# Patient Record
Sex: Female | Born: 1935 | Race: White | Hispanic: No | Marital: Single | Smoking: Never smoker
Health system: Southern US, Community
[De-identification: ages and names within clinical notes are randomized; demographics above are authoritative.]

## PROBLEM LIST (undated history)

## (undated) DIAGNOSIS — M1990 Unspecified osteoarthritis, unspecified site: Secondary | ICD-10-CM

## (undated) HISTORY — PX: SHOULDER SURGERY: SHX246

---

## 2017-12-18 ENCOUNTER — Emergency Department: Payer: Medicare PPO

## 2017-12-18 ENCOUNTER — Inpatient Hospital Stay: Payer: Medicare PPO

## 2017-12-18 ENCOUNTER — Inpatient Hospital Stay
Admission: EM | Admit: 2017-12-18 | Discharge: 2017-12-25 | DRG: 871 | Disposition: A | Discharge status: Skilled Nursing Facility | Payer: Medicare PPO | Attending: Internal Medicine | Admitting: Internal Medicine

## 2017-12-18 DIAGNOSIS — I459 Conduction disorder, unspecified: Secondary | ICD-10-CM

## 2017-12-18 DIAGNOSIS — J9811 Atelectasis: Secondary | ICD-10-CM | POA: Diagnosis present

## 2017-12-18 DIAGNOSIS — R55 Syncope and collapse: Secondary | ICD-10-CM | POA: Diagnosis not present

## 2017-12-18 DIAGNOSIS — I248 Other forms of acute ischemic heart disease: Secondary | ICD-10-CM | POA: Diagnosis present

## 2017-12-18 DIAGNOSIS — J9601 Acute respiratory failure with hypoxia: Secondary | ICD-10-CM | POA: Diagnosis present

## 2017-12-18 DIAGNOSIS — E872 Acidosis: Secondary | ICD-10-CM | POA: Diagnosis present

## 2017-12-18 DIAGNOSIS — K5732 Diverticulitis of large intestine without perforation or abscess without bleeding: Secondary | ICD-10-CM | POA: Diagnosis present

## 2017-12-18 DIAGNOSIS — Z66 Do not resuscitate: Secondary | ICD-10-CM | POA: Diagnosis present

## 2017-12-18 DIAGNOSIS — W19XXXA Unspecified fall, initial encounter: Secondary | ICD-10-CM

## 2017-12-18 DIAGNOSIS — A419 Sepsis, unspecified organism: Principal | ICD-10-CM

## 2017-12-18 DIAGNOSIS — R7989 Other specified abnormal findings of blood chemistry: Secondary | ICD-10-CM

## 2017-12-18 DIAGNOSIS — R001 Bradycardia, unspecified: Secondary | ICD-10-CM | POA: Diagnosis present

## 2017-12-18 DIAGNOSIS — I442 Atrioventricular block, complete: Secondary | ICD-10-CM | POA: Diagnosis present

## 2017-12-18 DIAGNOSIS — E876 Hypokalemia: Secondary | ICD-10-CM | POA: Diagnosis present

## 2017-12-18 DIAGNOSIS — K529 Noninfective gastroenteritis and colitis, unspecified: Secondary | ICD-10-CM | POA: Diagnosis present

## 2017-12-18 DIAGNOSIS — M1990 Unspecified osteoarthritis, unspecified site: Secondary | ICD-10-CM | POA: Diagnosis present

## 2017-12-18 HISTORY — DX: Unspecified osteoarthritis, unspecified site: M19.90

## 2017-12-18 LAB — BASIC METABOLIC PANEL
Anion gap: 13 (ref 5–15)
BUN: 18 mg/dL (ref 6–20)
CHLORIDE: 99 mmol/L — AB (ref 101–111)
CO2: 26 mmol/L (ref 22–32)
Calcium: 8.8 mg/dL — ABNORMAL LOW (ref 8.9–10.3)
Creatinine, Ser: 0.96 mg/dL (ref 0.44–1.00)
GFR calc Af Amer: >60 mL/min (ref >60)
GFR calc non Af Amer: 54 mL/min — ABNORMAL LOW (ref >60)
Glucose, Bld: 220 mg/dL — ABNORMAL HIGH (ref 65–99)
POTASSIUM: 3.1 mmol/L — AB (ref 3.5–5.1)
SODIUM: 138 mmol/L (ref 135–145)

## 2017-12-18 LAB — CBC
HEMATOCRIT: 37 % (ref 35.0–47.0)
Hemoglobin: 11.6 g/dL — ABNORMAL LOW (ref 12.0–16.0)
MCH: 25.8 pg — ABNORMAL LOW (ref 26.0–34.0)
MCHC: 31.5 g/dL — ABNORMAL LOW (ref 32.0–36.0)
MCV: 82.1 fL (ref 80.0–100.0)
Platelets: 513 10*3/uL — ABNORMAL HIGH (ref 150–440)
RBC: 4.51 MIL/uL (ref 3.80–5.20)
RDW: 17.3 % — AB (ref 11.5–14.5)
WBC: 22.9 10*3/uL — AB (ref 3.6–11.0)

## 2017-12-18 LAB — URINALYSIS, COMPLETE (UACMP) WITH MICROSCOPIC
BILIRUBIN URINE: NEGATIVE
Bacteria, UA: NONE SEEN
GLUCOSE, UA: NEGATIVE mg/dL
KETONES UR: NEGATIVE mg/dL
LEUKOCYTES UA: NEGATIVE
Nitrite: NEGATIVE
PH: 5 (ref 5.0–8.0)
Protein, ur: 100 mg/dL — AB
Specific Gravity, Urine: 1.018 (ref 1.005–1.030)

## 2017-12-18 LAB — TROPONIN I
TROPONIN I: 0.56 ng/mL — AB (ref <0.03)
Troponin I: 0.06 ng/mL (ref <0.03)

## 2017-12-18 LAB — GLUCOSE, CAPILLARY: Glucose-Capillary: 129 mg/dL — ABNORMAL HIGH (ref 65–99)

## 2017-12-18 LAB — LACTIC ACID, PLASMA
Lactic Acid, Venous: 5.9 mmol/L (ref 0.5–1.9)
Lactic Acid, Venous: 2 mmol/L (ref 0.5–1.9)

## 2017-12-18 LAB — MAGNESIUM: MAGNESIUM: 1.6 mg/dL — AB (ref 1.7–2.4)

## 2017-12-18 MED ORDER — IOHEXOL 300 MG/ML  SOLN: 100.0000 mL | Freq: Once | INTRAMUSCULAR | Status: DC | PRN

## 2017-12-18 MED ORDER — PIPERACILLIN-TAZOBACTAM 3.375 G IVPB 30 MIN
3.3750 g | Freq: Once | INTRAVENOUS | Status: AC
  Administered 2017-12-18: 3.375 g via INTRAVENOUS
  Filled 2017-12-18: qty 50

## 2017-12-18 MED ORDER — PIPERACILLIN-TAZOBACTAM 3.375 G IVPB: 3.3750 g | Freq: Three times a day (TID) | INTRAVENOUS | Status: DC

## 2017-12-18 MED ORDER — LIDOCAINE HCL (PF) 1 % IJ SOLN
INTRAMUSCULAR | Status: AC
  Administered 2017-12-18: 10 mL
  Filled 2017-12-18: qty 10

## 2017-12-18 MED ORDER — SODIUM CHLORIDE 0.9 % IV BOLUS: 1000.0000 mL | Freq: Once | INTRAVENOUS | Status: DC

## 2017-12-18 MED ORDER — FENTANYL CITRATE (PF) 100 MCG/2ML IJ SOLN
50.0000 ug | Freq: Once | INTRAMUSCULAR | Status: AC
  Administered 2017-12-18: 50 ug via INTRAVENOUS

## 2017-12-18 MED ORDER — FENTANYL CITRATE (PF) 100 MCG/2ML IJ SOLN
25.0000 ug | Freq: Once | INTRAMUSCULAR | Status: AC
  Administered 2017-12-18: 25 ug via INTRAVENOUS

## 2017-12-18 MED ORDER — SODIUM CHLORIDE 0.9 % IV SOLN
Freq: Three times a day (TID) | INTRAVENOUS | Status: DC
  Administered 2017-12-19: 06:00:00 via INTRAVENOUS
  Filled 2017-12-18 (×5): qty 100

## 2017-12-18 MED ORDER — ONDANSETRON HCL 4 MG PO TABS: 4.0000 mg | ORAL_TABLET | Freq: Four times a day (QID) | ORAL | Status: DC | PRN

## 2017-12-18 MED ORDER — FENTANYL CITRATE (PF) 100 MCG/2ML IJ SOLN
INTRAMUSCULAR | Status: AC
  Filled 2017-12-18: qty 2

## 2017-12-18 MED ORDER — ENOXAPARIN SODIUM 40 MG/0.4ML ~~LOC~~ SOLN
40.0000 mg | Freq: Two times a day (BID) | SUBCUTANEOUS | Status: DC
  Administered 2017-12-18 – 2017-12-19 (×2): 40 mg via SUBCUTANEOUS
  Filled 2017-12-18 (×2): qty 0.4

## 2017-12-18 MED ORDER — ACETAMINOPHEN 325 MG PO TABS
650.0000 mg | ORAL_TABLET | Freq: Four times a day (QID) | ORAL | Status: DC | PRN
  Administered 2017-12-21: 650 mg via ORAL
  Filled 2017-12-18: qty 2

## 2017-12-18 MED ORDER — LIDOCAINE HCL (PF) 1 % IJ SOLN
10.0000 mL | Freq: Once | INTRAMUSCULAR | Status: AC
  Administered 2017-12-18: 10 mL
  Filled 2017-12-18: qty 10

## 2017-12-18 MED ORDER — SODIUM CHLORIDE 0.9 % IV BOLUS
1000.0000 mL | Freq: Once | INTRAVENOUS | Status: AC
Start: 2017-12-18 — End: 2017-12-18
  Administered 2017-12-18 (×2): 1000 mL via INTRAVENOUS

## 2017-12-18 MED ORDER — ACETAMINOPHEN 650 MG RE SUPP: 650.0000 mg | Freq: Four times a day (QID) | RECTAL | Status: DC | PRN

## 2017-12-18 MED ORDER — TRAMADOL HCL 50 MG PO TABS
50.0000 mg | ORAL_TABLET | Freq: Four times a day (QID) | ORAL | Status: DC | PRN
  Administered 2017-12-18 – 2017-12-23 (×4): 50 mg via ORAL
  Filled 2017-12-18 (×3): qty 1

## 2017-12-18 MED ORDER — VANCOMYCIN HCL IN DEXTROSE 1-5 GM/200ML-% IV SOLN
1000.0000 mg | Freq: Once | INTRAVENOUS | Status: DC
  Administered 2017-12-18: 1000 mg via INTRAVENOUS
  Filled 2017-12-18: qty 200

## 2017-12-18 MED ORDER — IOPAMIDOL (ISOVUE-300) INJECTION 61%
30.0000 mL | Freq: Once | INTRAVENOUS | Status: AC
  Administered 2017-12-18: 30 mL via ORAL

## 2017-12-18 MED ORDER — POTASSIUM CHLORIDE 10 MEQ/100ML IV SOLN
10.0000 meq | INTRAVENOUS | Status: AC
  Administered 2017-12-18: 10 meq via INTRAVENOUS
  Filled 2017-12-18 (×4): qty 100

## 2017-12-18 MED ORDER — MIDAZOLAM HCL 5 MG/5ML IJ SOLN
INTRAMUSCULAR | Status: AC
  Filled 2017-12-18: qty 5

## 2017-12-18 MED ORDER — POTASSIUM CHLORIDE 10 MEQ/100ML IV SOLN
10.0000 meq | INTRAVENOUS | Status: AC
  Administered 2017-12-18 – 2017-12-19 (×3): 10 meq via INTRAVENOUS
  Filled 2017-12-18 (×3): qty 100

## 2017-12-18 MED ORDER — SODIUM CHLORIDE 0.9% FLUSH
3.0000 mL | Freq: Two times a day (BID) | INTRAVENOUS | Status: DC
  Administered 2017-12-18 – 2017-12-25 (×14): 3 mL via INTRAVENOUS

## 2017-12-18 MED ORDER — ONDANSETRON HCL 4 MG/2ML IJ SOLN: 4.0000 mg | Freq: Four times a day (QID) | INTRAMUSCULAR | Status: DC | PRN

## 2017-12-18 MED ORDER — IOPAMIDOL (ISOVUE-300) INJECTION 61%
100.0000 mL | Freq: Once | INTRAVENOUS | Status: AC | PRN
  Administered 2017-12-18: 100 mL via INTRAVENOUS

## 2017-12-18 MED ORDER — TRAMADOL HCL 50 MG PO TABS
ORAL_TABLET | ORAL | Status: AC
  Administered 2017-12-19: 50 mg via ORAL
  Filled 2017-12-18: qty 1

## 2017-12-18 MED ORDER — ALBUTEROL SULFATE (2.5 MG/3ML) 0.083% IN NEBU: 2.5000 mg | INHALATION_SOLUTION | RESPIRATORY_TRACT | Status: DC | PRN

## 2017-12-18 NOTE — ED Notes (Signed)
Family visiting pt at bedside  °

## 2017-12-18 NOTE — Progress Notes (Signed)
Pharmacy Antibiotic Note  Sydney MonksCarol Ziff is a 84 y.o. female admitted on 12/18/2017 with IAI.  Pharmacy has been consulted for Zosyn dosing.  Plan: Zosyn 4.5 gm IV Q8H EI (due to BMI greater than 40)  Height: 5\' 5"  (165.1 cm) Weight: 252 lb 3.2 oz (114.4 kg) IBW/kg (Calculated) : 57  No data recorded.  Recent Labs  Lab 12/18/17 1618 12/18/17 1633  WBC 22.9*  --   CREATININE 0.96  --   LATICACIDVEN  --  5.9*    Estimated Creatinine Clearance: 57.1 mL/min (by C-G formula based on SCr of 0.96 mg/dL).    No Known Allergies  Antimicrobials this admission: Vanc/Zosyn in ED  Dose adjustments this admission:   Microbiology results: 4/16 BCx: pending  UCx:    Sputum:    MRSA PCR:   Thank you for allowing pharmacy to be a part of this patient's care.  Carola FrostNathan A Ellesse Antenucci, Pharm.D., BCPS Clinical Pharmacist 12/18/2017 6:34 PM

## 2017-12-18 NOTE — H&P (Signed)
[facility] [place] at [facility]   PATIENT NAME: Sydney Mitchell    MR#:  [id number]  DATE OF BIRTH:  05/30/1934  DATE OF ADMISSION:  12/18/2017  PRIMARY CARE PHYSICIAN: Patient, No Pcp Per   REQUESTING/REFERRING PHYSICIAN: Dr. Derrill Kay  CHIEF COMPLAINT:   Chief Complaint  Patient presents with  . Bradycardia  . Fall    HISTORY OF PRESENT ILLNESS:  Sydney Mitchell  is a 84 y.o. female with a known history of osteoarthritis presented to the hospital after patient had dizziness and fall.  EMS was called.  Patient was found to be bradycardic in the 20s.  A temporary pacemaker was placed.  Pulse was lost in the middle and she had CPR for 2-3 minutes.  Regained circulation.  Patient was alert and oriented when she arrived to the emergency room.  Due to bradycardia patient had a temporary pacemaker placed by Dr. Lady Gary. Patient has had diarrhea for a week of watery 5-6 episodes of stool daily with abdominal pain and nausea.  No blood in stool.  No recent antibiotic use.  Afebrile.  Patient does have elevated white count of 22.6.  Lactic acid at 5.9.  PAST MEDICAL HISTORY:   Past Medical History:  Diagnosis Date  . Osteoarthritis     PAST SURGICAL HISTORY:  None SOCIAL HISTORY:   Social History   Tobacco Use  . Smoking status: Not on file  Substance Use Topics  . Alcohol use: Not on file    FAMILY HISTORY:  No family history on file. No colon cancer  DRUG ALLERGIES:  No Known Allergies  REVIEW OF SYSTEMS:   Review of Systems  Constitutional: Positive for malaise/fatigue. Negative for chills, fever and weight loss.  HENT: Negative for hearing loss and nosebleeds.   Eyes: Negative for blurred vision, double vision and pain.  Respiratory: Negative for cough, hemoptysis, sputum production, shortness of breath and wheezing.   Cardiovascular: Negative for chest pain, palpitations, orthopnea and leg swelling.  Gastrointestinal: Positive for abdominal pain,  diarrhea and nausea. Negative for constipation and vomiting.  Genitourinary: Negative for dysuria and hematuria.  Musculoskeletal: Positive for falls and joint pain. Negative for back pain and myalgias.  Skin: Negative for rash.  Neurological: Positive for dizziness. Negative for tremors, sensory change, speech change, focal weakness, seizures and headaches.  Endo/Heme/Allergies: Does not bruise/bleed easily.  Psychiatric/Behavioral: Negative for depression and memory loss. The patient is not nervous/anxious.     MEDICATIONS AT HOME:   Prior to Admission medications   Not on File     VITAL SIGNS:  Blood pressure (!) 115/45, pulse 70, resp. rate (!) 22, height 5\' 5"  (1.651 m), weight 114.4 kg (252 lb 3.2 oz), SpO2 96 %.  PHYSICAL EXAMINATION:  Physical Exam  GENERAL:  84 y.o.-year-old patient lying in the bed.  Looks critically ill.  obese EYES: Pupils equal, round, reactive to light and accommodation. No scleral icterus. Extraocular muscles intact.  HEENT: Head atraumatic, normocephalic. Oropharynx and nasopharynx clear. No oropharyngeal erythema, moist oral mucosa  NECK:  Supple, no jugular venous distention. No thyroid enlargement, no tenderness.  LUNGS: Normal breath sounds bilaterally, no wheezing, rales, rhonchi. No use of accessory muscles of respiration.  CARDIOVASCULAR: S1, S2 normal. No murmurs, rubs, or gallops.  ABDOMEN: Soft, diffuse tenderness, nondistended. Bowel sounds present. No organomegaly or mass.  EXTREMITIES: No pedal edema, cyanosis, or clubbing. + 2 pedal & radial pulses b/l.   NEUROLOGIC: Cranial nerves II through XII are intact. No focal  Motor or sensory deficits appreciated b/l PSYCHIATRIC: The patient is alert and oriented x 3. Good affect.  SKIN: No obvious rash.  LABORATORY PANEL:   CBC Recent Labs  Lab 12/18/17 1618  WBC 22.9*  HGB 11.6*  HCT 37.0  PLT 513*    ------------------------------------------------------------------------------------------------------------------  Chemistries  Recent Labs  Lab 12/18/17 1618  NA 138  K 3.1*  CL 99*  CO2 26  GLUCOSE 220*  BUN 18  CREATININE 0.96  CALCIUM 8.8*   ------------------------------------------------------------------------------------------------------------------  Cardiac Enzymes Recent Labs  Lab 12/18/17 1618  TROPONINI 0.06*   ------------------------------------------------------------------------------------------------------------------  RADIOLOGY:  No results found.   IMPRESSION AND PLAN:   *Bradycardia.  Etiology unclear.  Patient does not take any medications.  Could be electrolyte imbalances.  At this time she has a temporary pacemaker.  Discussed with cardiology Dr. Lady GaryFath.  Will get echocardiogram. Replace potassium.  Check magnesium.  *Sepsis with diarrhea and abdominal pain.  We will start IV antibiotics.  Check C. difficile and stool PCR.  Also ordered a CT scan of the abdomen and pelvis to rule out colitis or diverticulitis.  *Osteoarthritis.  Pain medications as needed.  *DVT prophylaxis with Lovenox  All the records are reviewed and case discussed with ED provider. Management plans discussed with the patient, family and they are in agreement.  Discussed with patient in detail regarding acute episode and treatment plan.  We discussed regarding her CODE STATUS.  Patient does not want intubation or CPR.  She is okay having a permanent pacemaker placed. Patient's daughter Sydney Mitchell is her healthcare power of attorney. Discussed with daughter in detail.  CODE STATUS: DNR  TOTAL CRITICAL CARE  TIME TAKING CARE OF THIS PATIENT: 80 minutes.   Orie FishermanSrikar R Teal Raben M.D on 12/18/2017 at 6:21 PM  Between 7am to 6pm - Pager - [phone]  After 6pm go to [email or web address] - password [email or web address]  [facility]  Office  [phone]  CC: Primary  care physician; Patient, No Pcp Per  Note: This dictation was prepared with Dragon dictation along with smaller phrase technology. Any transcriptional errors that result from this process are unintentional.

## 2017-12-18 NOTE — ED Notes (Signed)
Attempted to call report. Gave my name and number and awaiting call back from ICU RN.

## 2017-12-18 NOTE — ED Provider Notes (Signed)
[facility] Emergency Department Provider Note  ____________________________________________   I have reviewed the triage vital signs and the nursing notes.   HISTORY  Chief Complaint Bradycardia and Fall   History limited by: Not Limited   HPI Sydney Mitchell is a 84 y.o. female who presents to the emergency department today because of concern for fall. Patient is coming from home and came via EMS. When EMS arrived they noted that the patient's heart rate was in the 20s and 30s. Patient had decreased responsiveness. EMS started pacing the patient and patient became more lucid. The patient apparently has not been to a doctor in 4 years and is not on any medication. The patient is complaining of some headache.    No past medical history on file.  There are no active problems to display for this patient.   Prior to Admission medications   Not on File    Allergies Patient has no known allergies.  No family history on file.  Social History Social History   Tobacco Use  . Smoking status: Not on file  Substance Use Topics  . Alcohol use: Not on file  . Drug use: Not on file    Review of Systems Constitutional: No fever/chills Eyes: No visual changes. ENT: No sore throat. Cardiovascular: Denies chest pain. Respiratory: Denies shortness of breath. Gastrointestinal: Positive for diarrhea. Genitourinary: Negative for dysuria. Musculoskeletal: Negative for back pain. Skin: Negative for rash. Neurological: Positive for headache  ____________________________________________   PHYSICAL EXAM:  VITAL SIGNS: ED Triage Vitals  Enc Vitals Group     BP 12/18/17 1630 (!) 96/57     Pulse Rate 12/18/17 1614 (!) 57     Resp 12/18/17 1614 (!) 23     Temp --      Temp src --      SpO2 --      Weight 12/18/17 1619 252 lb 3.2 oz (114.4 kg)     Height 12/18/17 1619 5\' 5"  (1.651 m)   Constitutional: Awake, alert.  Eyes: Conjunctivae are normal.   ENT   Head: Normocephalic and atraumatic.   Nose: No congestion/rhinnorhea.   Mouth/Throat: Mucous membranes are moist.   Neck: No stridor. Hematological/Lymphatic/Immunilogical: No cervical lymphadenopathy. Cardiovascular: Normal rate, actively being paced. Respiratory: Normal respiratory effort without tachypnea nor retractions. Breath sounds are clear and equal bilaterally. No wheezes/rales/rhonchi. Gastrointestinal: Soft and non tender. No rebound. No guarding.  Genitourinary: Deferred Musculoskeletal: Normal range of motion in all extremities. No lower extremity edema. Neurologic:  Normal speech and language. No gross focal neurologic deficits are appreciated.  Skin:  Skin is warm, dry and intact. No rash noted. Psychiatric: Mood and affect are normal. Speech and behavior are normal. Patient exhibits appropriate insight and judgment.  ____________________________________________    LABS (pertinent positives/negatives)  Lactic acid 5.9 BMP na 138, k 3.1, cl 99, glu 220 CBC wbc 22.9, hgb 11.6, plt 513 Trop 0.06  ____________________________________________   EKG  I, Phineas SemenGraydon Cristin Penaflor, attending physician, personally viewed and interpreted this EKG  EKG Time: 1613 Rate: 35 Rhythm: unclear 3rd degree vs 2nd degree mobitz type II AV block Axis: left axis deivation Intervals: qtc 474 QRS: IVCD ST changes: no st elevation Impression: abnormal ekg   ____________________________________________    RADIOLOGY  CT head/cervical spine No acute abnormality  ____________________________________________   PROCEDURES  Procedures  CRITICAL CARE Performed by: Phineas SemenGOODMAN, Andreya Lacks   Total critical care time: 40 minutes  Critical care time was exclusive of separately billable procedures and  treating other patients.  Critical care was necessary to treat or prevent imminent or life-threatening deterioration.  Critical care was time spent personally by me on  the following activities: development of treatment plan with patient and/or surrogate as well as nursing, discussions with consultants, evaluation of patient's response to treatment, examination of patient, obtaining history from patient or surrogate, ordering and performing treatments and interventions, ordering and review of laboratory studies, ordering and review of radiographic studies, pulse oximetry and re-evaluation of patient's condition.  ____________________________________________   INITIAL IMPRESSION / ASSESSMENT AND PLAN / ED COURSE  Pertinent labs & imaging results that were available during my care of the patient were reviewed by me and considered in my medical decision making (see chart for details).  Patient presented to the emergency department today because of concerns for fall and then bradycardia noted by EMS.  When patient arrived she was actively being paced.  She was awake and alert.  She was complaining of headache.  When we transferred the patient over to our pacer pads we did not get an EKG which did show high-grade AV block.  The patient was placed on her pacer pads successfully.  She had good capture.  Head CT was obtained given concern for headache and fall however no acute intracranial or cervical spine injury.  Dr. Lady Gary with cardiology was called and did place a temporary pacer.  Patient's blood work came back concerning for possible infection with significantly elevated lactic acid level and leukocytosis.  Patient was given multiple broad-spectrum antibiotics.  Patient will be admitted to the hospitalist service.  ____________________________________________   FINAL CLINICAL IMPRESSION(S) / ED DIAGNOSES  Final diagnoses:  Fall, initial encounter  Heart block  Bradycardia  Sepsis, due to unspecified organism (HCC)  Elevated lactic acid level     Note: This dictation was prepared with Dragon dictation. Any transcriptional errors that result from this process  are unintentional     Phineas Semen, MD 12/18/17 1843

## 2017-12-18 NOTE — Progress Notes (Signed)
CODE SEPSIS - PHARMACY COMMUNICATION  **Broad Spectrum Antibiotics should be administered within 1 hour of Sepsis diagnosis**  Time Code Sepsis Called/Page Received: 17:40  Antibiotics Ordered: Vanc/Zosyn  Time of 1st antibiotic administration: 17:05  Additional action taken by pharmacy:   If necessary, Name of Provider/Nurse Contacted:     Carola FrostNathan A Kelle Ruppert ,PharmD Clinical Pharmacist  12/18/2017  5:41 PM

## 2017-12-18 NOTE — ED Notes (Addendum)
Pt on 4 L nasal cannula at this time.   Pt states she got dizzy in bedroom, fell backwards on to carpet.

## 2017-12-18 NOTE — ED Notes (Signed)
Pt taken to CT and back with this RN, Trish MageAnna Rn, Dr. Derrill KayGoodman, and 2 ED techs. Pt is alert and talking. C/o of head pain. C/o of the beating on her chest pain, explained that is from the pacemaker which is keeping her heart rate up.

## 2017-12-18 NOTE — ED Notes (Signed)
Date and time results received: 12/18/17 1708  Test: troponin Critical Value: 0.06  Name of Provider Notified: Dr. Derrill KayGoodman

## 2017-12-18 NOTE — ED Notes (Signed)
Dr. Lady GaryFath at bedside to perform temporary pacemaker placement. Kadijah ED tech along with this RN at bedside to assist.

## 2017-12-18 NOTE — ED Notes (Signed)
Pt returned from xray

## 2017-12-18 NOTE — ED Notes (Signed)
Date and time results received: 12/18/17 1708   Test: lactic acid Critical Value: 5.9  Name of Provider Notified: Dr. Derrill KayGoodman

## 2017-12-18 NOTE — Consult Note (Signed)
Cardiology Consultation Note    Patient ID: Sydney Mitchell, MRN: [id number], DOB/AGE: 09-19-1933 84 y.o. Admit date: 12/18/2017   Date of Consult: 12/18/2017 Primary Physician: Patient, No Pcp Per Primary Cardiologist:     Chief Complaint: syncope Reason for Consultation: chb Requesting MD: Dr. Elpidio Anis  HPI: Nessa Ramaker is a 84 y.o. female with history of no aparent cardiac problems who recently moved [place] othe area. She has recently had anorexia and diarrhea. She states she has had liquid diarrhea which has worsened recently. She attempted to ambulate today but felt very dizzy followed by syncopep. EMS was called and found her to be hypotensive , bradycardic and unresponsive. She reportedly required cpr for several minutes. She had an external pacemaker placed. She remained hypotensive and radycardic. Her wbc was 22 and her K was 3.1. Her lactate was 5.9. Blood cultures were obtained.   Past Medical History:  Diagnosis Date  . Osteoarthritis       Surgical History:   Home Meds: Prior to Admission medications   Not on File    Inpatient Medications:  . enoxaparin (LOVENOX) injection  40 mg Subcutaneous Q12H  . fentaNYL      . sodium chloride flush  3 mL Intravenous Q12H   . small volume/piggyback builder    . sodium chloride      Allergies: No Known Allergies  Social History   Socioeconomic History  . Marital status: Single    Spouse name: Not on file  . Number of children: Not on file  . Years of education: Not on file  . Highest education level: Not on file  Occupational History  . Not on file  Social Needs  . Financial resource strain: Not on file  . Food insecurity:    Worry: Not on file    Inability: Not on file  . Transportation needs:    Medical: Not on file    Non-medical: Not on file  Tobacco Use  . Smoking status: Not on file  Substance and Sexual Activity  . Alcohol use: Not on file  . Drug use: Not on file  . Sexual activity: Not on file   Lifestyle  . Physical activity:    Days per week: Not on file    Minutes per session: Not on file  . Stress: Not on file  Relationships  . Social connections:    Talks on phone: Not on file    Gets together: Not on file    Attends religious service: Not on file    Active member of club or organization: Not on file    Attends meetings of clubs or organizations: Not on file    Relationship status: Not on file  . Intimate partner violence:    Fear of current or ex partner: Not on file    Emotionally abused: Not on file    Physically abused: Not on file    Forced sexual activity: Not on file  Other Topics Concern  . Not on file  Social History Narrative  . Not on file     No family history on file.   Review of Systems: A 12-system review of systems was performed and is negative except as noted in the HPI.  Labs: Recent Labs    12/18/17 1618  TROPONINI 0.06*   Lab Results  Component Value Date   WBC 22.9 (H) 12/18/2017   HGB 11.6 (L) 12/18/2017   HCT 37.0 12/18/2017   MCV 82.1 12/18/2017   PLT  513 (H) 12/18/2017    Recent Labs  Lab 12/18/17 1618  NA 138  K 3.1*  CL 99*  CO2 26  BUN 18  CREATININE 0.96  CALCIUM 8.8*  GLUCOSE 220*   No results found for: CHOL, HDL, LDLCALC, TRIG No results found for: DDIMER  Radiology/Studies:  Ct Head Wo Contrast  Result Date: 12/18/2017 CLINICAL DATA:  Fall. Dizziness. Bradycardia and brief CPR. Initial encounter. EXAM: CT HEAD WITHOUT CONTRAST CT CERVICAL SPINE WITHOUT CONTRAST TECHNIQUE: Multidetector CT imaging of the head and cervical spine was performed following the standard protocol without intravenous contrast. Multiplanar CT image reconstructions of the cervical spine were also generated. COMPARISON:  None. FINDINGS: CT HEAD FINDINGS Brain: There is no evidence of acute infarct, intracranial hemorrhage, mass, midline shift, or extra-axial fluid collection. Subcortical and periventricular white matter hypodensities  are nonspecific but compatible with mild-to-moderate chronic small vessel ischemic disease. There is mild-to-moderate cerebral atrophy. Vascular: Calcified atherosclerosis at the skull base. No hyperdense vessel. Skull: No fracture or focal osseous lesion. Sinuses/Orbits: Visualized paranasal sinuses and mastoid air cells are clear. Orbits are unremarkable. Other: Small right frontal scalp hematoma. CT CERVICAL SPINE FINDINGS Alignment: Trace anterolisthesis of C2 on C3 and trace retrolisthesis of C4 on C5, likely degenerative. Skull base and vertebrae: No acute fracture or suspicious osseous lesion. Soft tissues and spinal canal: No prevertebral fluid or swelling. No visible canal hematoma. Disc levels: Diffuse cervical disc degeneration, most advanced at C3-4 and C4-5 where there is severe disc space narrowing, endplate sclerosis, and endplate osteophyte formation. Mild-to-moderate spinal stenosis at C4-5. Moderate to severe left facet arthrosis at C2-3 with moderate facet arthrosis at other levels. Advanced right-sided neural foraminal stenosis from C3-4-C5-6. Disc calcification at C7-T1. Upper chest: Clear lung apices. Other: Extensive calcified atherosclerosis at the carotid bifurcations. IMPRESSION: 1. No evidence of acute intracranial abnormality. 2. Small right frontal scalp hematoma. 3. Mild-to-moderate chronic small vessel ischemic disease and cerebral atrophy. 4. No cervical spine fracture. 5. Advanced cervical disc degeneration. Electronically Signed   By: Sebastian Ache M.D.   On: 12/18/2017 18:22   Ct Chest W Contrast  Result Date: 12/18/2017 CLINICAL DATA:  Bradycardic. CPR for pulseless. Recent diarrhea. Elevated white blood cell count EXAM: CT CHEST, ABDOMEN, AND PELVIS WITH CONTRAST TECHNIQUE: Multidetector CT imaging of the chest, abdomen and pelvis was performed following the standard protocol during bolus administration of intravenous contrast. CONTRAST:  <See Chart> OMNIPAQUE IOHEXOL 300  MG/ML SOLN, ISOVUE-300 IOPAMIDOL (ISOVUE-300) INJECTION 61% COMPARISON:  Chest radiograph 12/18/2017 FINDINGS: CT CHEST FINDINGS Cardiovascular: The pacing wires in the RIGHT atrium and ventricle from a LEFT groin approach no pericardial effusion. Coronary calcifications present no central pulmonary embolism. Mediastinum/Nodes: No axillary or supraclavicular adenopathy. No mediastinal hilar adenopathy. No pericardial effusion. Esophagus normal. Lungs/Pleura: RIGHT lower lobe atelectasis. Mildly elevated RIGHT hemidiaphragm. No airspace disease. No pneumothorax Musculoskeletal: No aggressive osseous lesion CT ABDOMEN PELVIS FINDING Hepatobiliary: No focal hepatic lesion. Postcholecystectomy. No biliary dilatation. Pancreas: Pancreas is normal. No ductal dilatation. No pancreatic inflammation. Spleen: Normal spleen Adrenals/urinary tract: Adrenal glands normal. Low-attenuation cysts within the RIGHT renal cortex. Ureters normal. Bladder wall trabeculation. Stomach/Bowel: Stomach, duodenum small-bowel normal. Appendix cecum normal. Ascending transverse colon normal. There are several diverticula of the transverse colon descending colon. Within the distal descending colon proximal sigmoid colon there is a long segment of bowel wall thickening and pericolonic inflammation. There multiple diverticula through this region. No macro perforation or abscess is identified. Or distal sigmoid colon and rectum  are normal. The sigmoid colon inflammation extends approximately 12 cm (for example image 93/3) Vascular/Lymphatic: Abdominal aorta is normal caliber with atherosclerotic calcification. There is no retroperitoneal or periportal lymphadenopathy. No pelvic lymphadenopathy. The celiac trunk, SMA and IMA are patent. Reproductive: Post hysterectomy Other: No free fluid. Musculoskeletal: No aggressive osseous lesion. There is chronic elevation of the pressure there is significant elevation of the LEFT hemidiaphragm.  IMPRESSION: Chest Impression: 1. Dense RIGHT basilar atelectasis. Atelectasis is presumably related to the markedly elevated RIGHT hemidiaphragm. 2.  Pacing leads in the RIGHT heart.  No pericardial effusion Abdomen / Pelvis Impression: 1. Long 12 cm segment of sigmoid colon inflammation in region of multiple diverticula is most consistent with acute diverticulitis. Segmental colitis would be a secondary consideration. Differential for colitis would include ischemia and inflammatory bowel disease. 2. No perforation or abscess. 3. No bladder wall trabeculation Electronically Signed   By: Genevive Bi M.D.   On: 12/18/2017 21:04   Ct Cervical Spine Wo Contrast  Result Date: 12/18/2017 CLINICAL DATA:  Fall. Dizziness. Bradycardia and brief CPR. Initial encounter. EXAM: CT HEAD WITHOUT CONTRAST CT CERVICAL SPINE WITHOUT CONTRAST TECHNIQUE: Multidetector CT imaging of the head and cervical spine was performed following the standard protocol without intravenous contrast. Multiplanar CT image reconstructions of the cervical spine were also generated. COMPARISON:  None. FINDINGS: CT HEAD FINDINGS Brain: There is no evidence of acute infarct, intracranial hemorrhage, mass, midline shift, or extra-axial fluid collection. Subcortical and periventricular white matter hypodensities are nonspecific but compatible with mild-to-moderate chronic small vessel ischemic disease. There is mild-to-moderate cerebral atrophy. Vascular: Calcified atherosclerosis at the skull base. No hyperdense vessel. Skull: No fracture or focal osseous lesion. Sinuses/Orbits: Visualized paranasal sinuses and mastoid air cells are clear. Orbits are unremarkable. Other: Small right frontal scalp hematoma. CT CERVICAL SPINE FINDINGS Alignment: Trace anterolisthesis of C2 on C3 and trace retrolisthesis of C4 on C5, likely degenerative. Skull base and vertebrae: No acute fracture or suspicious osseous lesion. Soft tissues and spinal canal: No  prevertebral fluid or swelling. No visible canal hematoma. Disc levels: Diffuse cervical disc degeneration, most advanced at C3-4 and C4-5 where there is severe disc space narrowing, endplate sclerosis, and endplate osteophyte formation. Mild-to-moderate spinal stenosis at C4-5. Moderate to severe left facet arthrosis at C2-3 with moderate facet arthrosis at other levels. Advanced right-sided neural foraminal stenosis from C3-4-C5-6. Disc calcification at C7-T1. Upper chest: Clear lung apices. Other: Extensive calcified atherosclerosis at the carotid bifurcations. IMPRESSION: 1. No evidence of acute intracranial abnormality. 2. Small right frontal scalp hematoma. 3. Mild-to-moderate chronic small vessel ischemic disease and cerebral atrophy. 4. No cervical spine fracture. 5. Advanced cervical disc degeneration. Electronically Signed   By: Sebastian Ache M.D.   On: 12/18/2017 18:22   Ct Abdomen Pelvis W Contrast  Result Date: 12/18/2017 CLINICAL DATA:  Bradycardic. CPR for pulseless. Recent diarrhea. Elevated white blood cell count EXAM: CT CHEST, ABDOMEN, AND PELVIS WITH CONTRAST TECHNIQUE: Multidetector CT imaging of the chest, abdomen and pelvis was performed following the standard protocol during bolus administration of intravenous contrast. CONTRAST:  <See Chart> OMNIPAQUE IOHEXOL 300 MG/ML SOLN, ISOVUE-300 IOPAMIDOL (ISOVUE-300) INJECTION 61% COMPARISON:  Chest radiograph 12/18/2017 FINDINGS: CT CHEST FINDINGS Cardiovascular: The pacing wires in the RIGHT atrium and ventricle from a LEFT groin approach no pericardial effusion. Coronary calcifications present no central pulmonary embolism. Mediastinum/Nodes: No axillary or supraclavicular adenopathy. No mediastinal hilar adenopathy. No pericardial effusion. Esophagus normal. Lungs/Pleura: RIGHT lower lobe atelectasis. Mildly elevated RIGHT hemidiaphragm. No  airspace disease. No pneumothorax Musculoskeletal: No aggressive osseous lesion CT ABDOMEN PELVIS  FINDING Hepatobiliary: No focal hepatic lesion. Postcholecystectomy. No biliary dilatation. Pancreas: Pancreas is normal. No ductal dilatation. No pancreatic inflammation. Spleen: Normal spleen Adrenals/urinary tract: Adrenal glands normal. Low-attenuation cysts within the RIGHT renal cortex. Ureters normal. Bladder wall trabeculation. Stomach/Bowel: Stomach, duodenum small-bowel normal. Appendix cecum normal. Ascending transverse colon normal. There are several diverticula of the transverse colon descending colon. Within the distal descending colon proximal sigmoid colon there is a long segment of bowel wall thickening and pericolonic inflammation. There multiple diverticula through this region. No macro perforation or abscess is identified. Or distal sigmoid colon and rectum are normal. The sigmoid colon inflammation extends approximately 12 cm (for example image 93/3) Vascular/Lymphatic: Abdominal aorta is normal caliber with atherosclerotic calcification. There is no retroperitoneal or periportal lymphadenopathy. No pelvic lymphadenopathy. The celiac trunk, SMA and IMA are patent. Reproductive: Post hysterectomy Other: No free fluid. Musculoskeletal: No aggressive osseous lesion. There is chronic elevation of the pressure there is significant elevation of the LEFT hemidiaphragm. IMPRESSION: Chest Impression: 1. Dense RIGHT basilar atelectasis. Atelectasis is presumably related to the markedly elevated RIGHT hemidiaphragm. 2.  Pacing leads in the RIGHT heart.  No pericardial effusion Abdomen / Pelvis Impression: 1. Long 12 cm segment of sigmoid colon inflammation in region of multiple diverticula is most consistent with acute diverticulitis. Segmental colitis would be a secondary consideration. Differential for colitis would include ischemia and inflammatory bowel disease. 2. No perforation or abscess. 3. No bladder wall trabeculation Electronically Signed   By: Genevive Bi M.D.   On: 12/18/2017 21:04   Dg  Chest Portable 1 View  Result Date: 12/18/2017 CLINICAL DATA:  Pain following fall EXAM: PORTABLE CHEST 1 VIEW COMPARISON:  None. FINDINGS: There is marked elevation of the right hemidiaphragm. There is atelectasis in the right base. The lungs elsewhere clear. Heart size and pulmonary vascularity are normal. No adenopathy. There is aortic atherosclerosis. Bones are osteoporotic. There is a total shoulder replacement on the left. No pneumothorax. IMPRESSION: Marked elevation of the right hemidiaphragm with right base atelectasis. Lungs elsewhere clear. Heart size normal. No pneumothorax. There is aortic atherosclerosis. Aortic Atherosclerosis (ICD10-I70.0). Electronically Signed   By: Bretta Bang III M.D.   On: 12/18/2017 18:52   Dg Hip Unilat W Or Wo Pelvis 2-3 Views Left  Result Date: 12/18/2017 CLINICAL DATA:  Pain following fall EXAM: DG HIP (WITH OR WITHOUT PELVIS) 2-3V LEFT COMPARISON:  None. FINDINGS: Frontal pelvis as well as frontal and lateral left hip images were obtained. Bones are diffusely osteoporotic. No fracture or dislocation is appreciable. There is marked narrowing of the left hip joint with bony eburnation on both sides of the left hip joint. There is mild narrowing of the right hip joint. No erosive changes. IMPRESSION: Diffuse osteoporotic diffuse osteoporosis. Marked osteoarthritis in the left hip joint. Milder osteoarthritic change in the right hip joint. No fracture evident. Note that a subtle impaction injury could easily be obscured given this degree of osteoporosis. If there remains concern for potential proximal femur fracture on the left, correlation with cross-sectional imaging may be of value in this circumstance. Electronically Signed   By: Bretta Bang III M.D.   On: 12/18/2017 18:51    Wt Readings from Last 3 Encounters:  12/18/17 92.2 kg (203 lb 4.2 oz)    EKG: chb with ventrcular escped at 20  Physical Exam:  Blood pressure 119/64, pulse 68, temperature  98.1 F (36.7 C), temperature  source Oral, resp. rate (!) 32, height 5\' 1"  (1.549 m), weight 92.2 kg (203 lb 4.2 oz), SpO2 97 %. Body mass index is 38.41 kg/m. General: Well developed, well nourished, in no acute distress. Head: Normocephalic, atraumatic, sclera non-icteric, no xanthomas, nares are without discharge.  Neck: Negative for carotid bruits. JVD not elevated. Lungs: Clear bilaterally to auscultation without wheezes, rales, or rhonchi. Breathing is unlabored. Heart: bradycardic with S1 S2. No murmurs, rubs, or gallops appreciated. Abdomen: Soft, non-tender, non-distended with normoactive bowel sounds. No hepatomegaly. No rebound/guarding. No obvious abdominal masses. Msk:  Strength and tone appear normal for age. Extremities: No clubbing or cyanosis. 4+ lower extremety edema  Distal pedal pulses are 2+ and equal bilaterally. Neuro: Alert and oriented X 3. No facial asymmetry. No focal deficit. Moves all extremities spontaneously. Psych:  Responds to questions appropriately with a normal affect.     Assessment and Plan  84 yo female with history of diarrhea for severl days now presenting to the er with a syncopal episode and was noted to have bradycardia and chb. External pacer was placed followed by a temporary pacemaker via right femoral approach. K was 3.1. WBC elevated at 22. Pt did received several minites of cpr. No chest pain. Troponin was 0.06. Etiology of the chb is unclear. K was mildy low but does not appear to be the cause. Currently with temp transvenous pacemaker. Will rule out for an mi and follow cultures. If pateitn still requiing pacing will need to consider ppm. Will continue with emperic abx and follow cultures. Echo to evaluate lv function.   Signed, Dalia HeadingKenneth A Charrie Mcconnon MD 12/18/2017, 9:33 PM Pager: [phone]

## 2017-12-18 NOTE — ED Notes (Signed)
Admitting at bedside 

## 2017-12-18 NOTE — ED Triage Notes (Signed)
Pt arrives [facility] from home emergency traffic. Fall at home. Vomiting and constipation at home recently, took laxatives and has been having diarrhea. EMS states pt L leg rotation and shortening, pt moving L foot. Pt arrives pale. EMS was called to house for a fall, dizziness. When they got pt in ambulance noticed HR was 20, began pacing pt. Lost capture and performed 3 minutes of CPR, 1 epi was given. Prior to CPR pt was alert, oriented, good BP with her HR in 20's. After being paced pt c/o of pain, EMS gave 2.5 versed through 22 L AC. EMS reports that pt hasn't been to a doctor in 4-5 years, doesn't take any medications at home. EMS reports after pacing they were unable to get a BP. CBG 167. Denied CP prior to being paced.

## 2017-12-18 NOTE — Progress Notes (Signed)
Pharmacist - Prescriber Communication  Enoxaparin dose has been modified from 40 mg subcutaneously once daily to 40 mg subcutaneously twice daily due to BMI greater than 40.  Momina Hunton A. [place], [place].D., BCPS Clinical Pharmacist 18:52 12/18/17

## 2017-12-18 NOTE — Progress Notes (Signed)
Temporary pacemaker  Indication-chb Emergent procedure. Discussed with patient and family  Right femoral vein site prepped  And anesthetized with lidocaine 1%. Femoral sheath inserted into the right femoral vein without difficulty over guide wire. Balloon tipped temproary pacer wire was inserted into the right ventrical. Capture obtrained. No immediate complications. External pacer turned off.  CXR ordered.

## 2017-12-18 NOTE — ED Notes (Signed)
Family at bedside. 

## 2017-12-18 NOTE — Consult Note (Addendum)
Name: Sydney Mitchell MRN: [id number] DOB: [birth year]    ADMISSION DATE:  12/18/2017 CONSULTATION DATE: 12/18/2017  REFERRING MD : Dr. Elpidio Anis   CHIEF COMPLAINT: Syncope  BRIEF PATIENT DESCRIPTION:  84 yo female admitted with syncope secondary to severe symptomatic bradycardia requiring transvenous pacemaker placement and sepsis secondary to acute diverticulitis   SIGNIFICANT EVENTS  04/16-Pt admitted to ICU   STUDIES:  CT Abd Pelvis 04/16>>Long 12 cm segment of sigmoid colon inflammation in region of multiple diverticula is most consistent with acute diverticulitis. Segmental colitis would be a secondary consideration. Differential for colitis would include ischemia and inflammatory bowel disease. No perforation or abscess. No bladder wall trabeculation CT Chest 04/16>>Dense RIGHT basilar atelectasis. Atelectasis is presumably related to the markedly elevated RIGHT hemidiaphragm. Pacing leads in the RIGHT heart.  No pericardial effusion  HISTORY OF PRESENT ILLNESS:   This is an 84 yo female with a PMH of Osteoarthritis.  She presented to [facility] ER via EMS on 04/16 after an episode of dizziness followed by syncope.  Upon EMS arrival at pts home she was hypotensive, bradycardic hr 20-30's, and unresponsive.  She reportedly required CPR for several minutes and required external pacing at which point she became more lucid.  The pt has not been examined by a physician in 4 years and is currently not on any medications.  In the ER EKG revealed hr 35, 3rd degree vs 2nd degree mobitz type II AB block and no st elevation.  Therefore, Cardiology consulted pt transported to cath lab for emergent transvenous pacemaker placement.  Lab results also revealed K+ 3.1, glucose 220, troponin 22.9, hgb, 11.6, and lactic acid 5.9. The pt also endorsed having diarrhea onset 2 weeks prior to presentation to ER, CT Abd Pelvis revealed acute diverticulitis and CT Chest revealed right basilar atelectasis.  She was  subsequently admitted to ICU post procedure for further workup and treatment.     PAST MEDICAL HISTORY :   has a past medical history of Osteoarthritis.  has a past surgical history that includes Shoulder surgery. Prior to Admission medications   Not on File   No Known Allergies  FAMILY HISTORY:  family history is not on file. SOCIAL HISTORY:    REVIEW OF SYSTEMS: Positives in BOLD   Constitutional: Negative for fever, chills, weight loss, malaise/fatigue and diaphoresis.  HENT: Negative for hearing loss, ear pain, nosebleeds, congestion, sore throat, neck pain, tinnitus and ear discharge.   Eyes: Negative for blurred vision, double vision, photophobia, pain, discharge and redness.  Respiratory: Negative for cough, hemoptysis, sputum production, shortness of breath, wheezing and stridor.   Cardiovascular: chest pain, palpitations, orthopnea, claudication, leg swelling and PND.  Gastrointestinal: heartburn, nausea, vomiting, abdominal pain, diarrhea, constipation, blood in stool and melena.  Genitourinary: Negative for dysuria, urgency, frequency, hematuria and flank pain.  Musculoskeletal: Negative for myalgias, back pain, joint pain and falls.  Skin: Negative for itching and rash.  Neurological: dizziness, tingling, tremors, sensory change, speech change, focal weakness, seizures, loss of consciousness, weakness and headaches.  Endo/Heme/Allergies: Negative for environmental allergies and polydipsia. Does not bruise/bleed easily.  SUBJECTIVE:  No complaints at this time  VITAL SIGNS: Temp:  [98.1 F (36.7 C)] 98.1 F (36.7 C) (04/16 2100) Pulse Rate:  [57-77] 68 (04/16 1930) Resp:  [16-32] 32 (04/16 1930) BP: (96-138)/(39-86) 119/64 (04/16 1930) SpO2:  [96 %-97 %] 97 % (04/16 1928) Weight:  [92.2 kg (203 lb 4.2 oz)-114.4 kg (252 lb 3.2 oz)] 92.2 kg (203 lb 4.2 oz) (  04/16 2100)  PHYSICAL EXAMINATION: General: well developed, well nourished obese female, NAD  Neuro: alert  and oriented, follows commands  HEENT: supple, no JVD Cardiovascular: paced rhythm, no M/R/G, right groin transvenous pacemaker in place dressing dry and intact  Lungs: diminished throughout, even, non labored  Abdomen: +BS x4, obese, soft, non tender, non distended  Musculoskeletal: 4+ bilateral lower extremity edema  Skin: no rashes or lesions   Recent Labs  Lab 12/18/17 1618  NA 138  K 3.1*  CL 99*  CO2 26  BUN 18  CREATININE 0.96  GLUCOSE 220*   Recent Labs  Lab 12/18/17 1618  HGB 11.6*  HCT 37.0  WBC 22.9*  PLT 513*   Ct Head Wo Contrast  Result Date: 12/18/2017 CLINICAL DATA:  Fall. Dizziness. Bradycardia and brief CPR. Initial encounter. EXAM: CT HEAD WITHOUT CONTRAST CT CERVICAL SPINE WITHOUT CONTRAST TECHNIQUE: Multidetector CT imaging of the head and cervical spine was performed following the standard protocol without intravenous contrast. Multiplanar CT image reconstructions of the cervical spine were also generated. COMPARISON:  None. FINDINGS: CT HEAD FINDINGS Brain: There is no evidence of acute infarct, intracranial hemorrhage, mass, midline shift, or extra-axial fluid collection. Subcortical and periventricular white matter hypodensities are nonspecific but compatible with mild-to-moderate chronic small vessel ischemic disease. There is mild-to-moderate cerebral atrophy. Vascular: Calcified atherosclerosis at the skull base. No hyperdense vessel. Skull: No fracture or focal osseous lesion. Sinuses/Orbits: Visualized paranasal sinuses and mastoid air cells are clear. Orbits are unremarkable. Other: Small right frontal scalp hematoma. CT CERVICAL SPINE FINDINGS Alignment: Trace anterolisthesis of C2 on C3 and trace retrolisthesis of C4 on C5, likely degenerative. Skull base and vertebrae: No acute fracture or suspicious osseous lesion. Soft tissues and spinal canal: No prevertebral fluid or swelling. No visible canal hematoma. Disc levels: Diffuse cervical disc  degeneration, most advanced at C3-4 and C4-5 where there is severe disc space narrowing, endplate sclerosis, and endplate osteophyte formation. Mild-to-moderate spinal stenosis at C4-5. Moderate to severe left facet arthrosis at C2-3 with moderate facet arthrosis at other levels. Advanced right-sided neural foraminal stenosis from C3-4-C5-6. Disc calcification at C7-T1. Upper chest: Clear lung apices. Other: Extensive calcified atherosclerosis at the carotid bifurcations. IMPRESSION: 1. No evidence of acute intracranial abnormality. 2. Small right frontal scalp hematoma. 3. Mild-to-moderate chronic small vessel ischemic disease and cerebral atrophy. 4. No cervical spine fracture. 5. Advanced cervical disc degeneration. Electronically Signed   By: Sebastian Ache M.D.   On: 12/18/2017 18:22   Ct Chest W Contrast  Result Date: 12/18/2017 CLINICAL DATA:  Bradycardic. CPR for pulseless. Recent diarrhea. Elevated white blood cell count EXAM: CT CHEST, ABDOMEN, AND PELVIS WITH CONTRAST TECHNIQUE: Multidetector CT imaging of the chest, abdomen and pelvis was performed following the standard protocol during bolus administration of intravenous contrast. CONTRAST:  <See Chart> OMNIPAQUE IOHEXOL 300 MG/ML SOLN, ISOVUE-300 IOPAMIDOL (ISOVUE-300) INJECTION 61% COMPARISON:  Chest radiograph 12/18/2017 FINDINGS: CT CHEST FINDINGS Cardiovascular: The pacing wires in the RIGHT atrium and ventricle from a LEFT groin approach no pericardial effusion. Coronary calcifications present no central pulmonary embolism. Mediastinum/Nodes: No axillary or supraclavicular adenopathy. No mediastinal hilar adenopathy. No pericardial effusion. Esophagus normal. Lungs/Pleura: RIGHT lower lobe atelectasis. Mildly elevated RIGHT hemidiaphragm. No airspace disease. No pneumothorax Musculoskeletal: No aggressive osseous lesion CT ABDOMEN PELVIS FINDING Hepatobiliary: No focal hepatic lesion. Postcholecystectomy. No biliary dilatation. Pancreas:  Pancreas is normal. No ductal dilatation. No pancreatic inflammation. Spleen: Normal spleen Adrenals/urinary tract: Adrenal glands normal. Low-attenuation cysts within the RIGHT  renal cortex. Ureters normal. Bladder wall trabeculation. Stomach/Bowel: Stomach, duodenum small-bowel normal. Appendix cecum normal. Ascending transverse colon normal. There are several diverticula of the transverse colon descending colon. Within the distal descending colon proximal sigmoid colon there is a long segment of bowel wall thickening and pericolonic inflammation. There multiple diverticula through this region. No macro perforation or abscess is identified. Or distal sigmoid colon and rectum are normal. The sigmoid colon inflammation extends approximately 12 cm (for example image 93/3) Vascular/Lymphatic: Abdominal aorta is normal caliber with atherosclerotic calcification. There is no retroperitoneal or periportal lymphadenopathy. No pelvic lymphadenopathy. The celiac trunk, SMA and IMA are patent. Reproductive: Post hysterectomy Other: No free fluid. Musculoskeletal: No aggressive osseous lesion. There is chronic elevation of the pressure there is significant elevation of the LEFT hemidiaphragm. IMPRESSION: Chest Impression: 1. Dense RIGHT basilar atelectasis. Atelectasis is presumably related to the markedly elevated RIGHT hemidiaphragm. 2.  Pacing leads in the RIGHT heart.  No pericardial effusion Abdomen / Pelvis Impression: 1. Long 12 cm segment of sigmoid colon inflammation in region of multiple diverticula is most consistent with acute diverticulitis. Segmental colitis would be a secondary consideration. Differential for colitis would include ischemia and inflammatory bowel disease. 2. No perforation or abscess. 3. No bladder wall trabeculation Electronically Signed   By: Genevive BiStewart  Edmunds M.D.   On: 12/18/2017 21:04   Ct Cervical Spine Wo Contrast  Result Date: 12/18/2017 CLINICAL DATA:  Fall. Dizziness. Bradycardia  and brief CPR. Initial encounter. EXAM: CT HEAD WITHOUT CONTRAST CT CERVICAL SPINE WITHOUT CONTRAST TECHNIQUE: Multidetector CT imaging of the head and cervical spine was performed following the standard protocol without intravenous contrast. Multiplanar CT image reconstructions of the cervical spine were also generated. COMPARISON:  None. FINDINGS: CT HEAD FINDINGS Brain: There is no evidence of acute infarct, intracranial hemorrhage, mass, midline shift, or extra-axial fluid collection. Subcortical and periventricular white matter hypodensities are nonspecific but compatible with mild-to-moderate chronic small vessel ischemic disease. There is mild-to-moderate cerebral atrophy. Vascular: Calcified atherosclerosis at the skull base. No hyperdense vessel. Skull: No fracture or focal osseous lesion. Sinuses/Orbits: Visualized paranasal sinuses and mastoid air cells are clear. Orbits are unremarkable. Other: Small right frontal scalp hematoma. CT CERVICAL SPINE FINDINGS Alignment: Trace anterolisthesis of C2 on C3 and trace retrolisthesis of C4 on C5, likely degenerative. Skull base and vertebrae: No acute fracture or suspicious osseous lesion. Soft tissues and spinal canal: No prevertebral fluid or swelling. No visible canal hematoma. Disc levels: Diffuse cervical disc degeneration, most advanced at C3-4 and C4-5 where there is severe disc space narrowing, endplate sclerosis, and endplate osteophyte formation. Mild-to-moderate spinal stenosis at C4-5. Moderate to severe left facet arthrosis at C2-3 with moderate facet arthrosis at other levels. Advanced right-sided neural foraminal stenosis from C3-4-C5-6. Disc calcification at C7-T1. Upper chest: Clear lung apices. Other: Extensive calcified atherosclerosis at the carotid bifurcations. IMPRESSION: 1. No evidence of acute intracranial abnormality. 2. Small right frontal scalp hematoma. 3. Mild-to-moderate chronic small vessel ischemic disease and cerebral atrophy. 4.  No cervical spine fracture. 5. Advanced cervical disc degeneration. Electronically Signed   By: Sebastian AcheAllen  Grady M.D.   On: 12/18/2017 18:22   Ct Abdomen Pelvis W Contrast  Result Date: 12/18/2017 CLINICAL DATA:  Bradycardic. CPR for pulseless. Recent diarrhea. Elevated white blood cell count EXAM: CT CHEST, ABDOMEN, AND PELVIS WITH CONTRAST TECHNIQUE: Multidetector CT imaging of the chest, abdomen and pelvis was performed following the standard protocol during bolus administration of intravenous contrast. CONTRAST:  <See Chart> OMNIPAQUE IOHEXOL  300 MG/ML SOLN, ISOVUE-300 IOPAMIDOL (ISOVUE-300) INJECTION 61% COMPARISON:  Chest radiograph 12/18/2017 FINDINGS: CT CHEST FINDINGS Cardiovascular: The pacing wires in the RIGHT atrium and ventricle from a LEFT groin approach no pericardial effusion. Coronary calcifications present no central pulmonary embolism. Mediastinum/Nodes: No axillary or supraclavicular adenopathy. No mediastinal hilar adenopathy. No pericardial effusion. Esophagus normal. Lungs/Pleura: RIGHT lower lobe atelectasis. Mildly elevated RIGHT hemidiaphragm. No airspace disease. No pneumothorax Musculoskeletal: No aggressive osseous lesion CT ABDOMEN PELVIS FINDING Hepatobiliary: No focal hepatic lesion. Postcholecystectomy. No biliary dilatation. Pancreas: Pancreas is normal. No ductal dilatation. No pancreatic inflammation. Spleen: Normal spleen Adrenals/urinary tract: Adrenal glands normal. Low-attenuation cysts within the RIGHT renal cortex. Ureters normal. Bladder wall trabeculation. Stomach/Bowel: Stomach, duodenum small-bowel normal. Appendix cecum normal. Ascending transverse colon normal. There are several diverticula of the transverse colon descending colon. Within the distal descending colon proximal sigmoid colon there is a long segment of bowel wall thickening and pericolonic inflammation. There multiple diverticula through this region. No macro perforation or abscess is identified. Or  distal sigmoid colon and rectum are normal. The sigmoid colon inflammation extends approximately 12 cm (for example image 93/3) Vascular/Lymphatic: Abdominal aorta is normal caliber with atherosclerotic calcification. There is no retroperitoneal or periportal lymphadenopathy. No pelvic lymphadenopathy. The celiac trunk, SMA and IMA are patent. Reproductive: Post hysterectomy Other: No free fluid. Musculoskeletal: No aggressive osseous lesion. There is chronic elevation of the pressure there is significant elevation of the LEFT hemidiaphragm. IMPRESSION: Chest Impression: 1. Dense RIGHT basilar atelectasis. Atelectasis is presumably related to the markedly elevated RIGHT hemidiaphragm. 2.  Pacing leads in the RIGHT heart.  No pericardial effusion Abdomen / Pelvis Impression: 1. Long 12 cm segment of sigmoid colon inflammation in region of multiple diverticula is most consistent with acute diverticulitis. Segmental colitis would be a secondary consideration. Differential for colitis would include ischemia and inflammatory bowel disease. 2. No perforation or abscess. 3. No bladder wall trabeculation Electronically Signed   By: Genevive Bi M.D.   On: 12/18/2017 21:04   Dg Chest Portable 1 View  Result Date: 12/18/2017 CLINICAL DATA:  Pain following fall EXAM: PORTABLE CHEST 1 VIEW COMPARISON:  None. FINDINGS: There is marked elevation of the right hemidiaphragm. There is atelectasis in the right base. The lungs elsewhere clear. Heart size and pulmonary vascularity are normal. No adenopathy. There is aortic atherosclerosis. Bones are osteoporotic. There is a total shoulder replacement on the left. No pneumothorax. IMPRESSION: Marked elevation of the right hemidiaphragm with right base atelectasis. Lungs elsewhere clear. Heart size normal. No pneumothorax. There is aortic atherosclerosis. Aortic Atherosclerosis (ICD10-I70.0). Electronically Signed   By: Bretta Bang III M.D.   On: 12/18/2017 18:52   Dg Hip  Unilat W Or Wo Pelvis 2-3 Views Left  Result Date: 12/18/2017 CLINICAL DATA:  Pain following fall EXAM: DG HIP (WITH OR WITHOUT PELVIS) 2-3V LEFT COMPARISON:  None. FINDINGS: Frontal pelvis as well as frontal and lateral left hip images were obtained. Bones are diffusely osteoporotic. No fracture or dislocation is appreciable. There is marked narrowing of the left hip joint with bony eburnation on both sides of the left hip joint. There is mild narrowing of the right hip joint. No erosive changes. IMPRESSION: Diffuse osteoporotic diffuse osteoporosis. Marked osteoarthritis in the left hip joint. Milder osteoarthritic change in the right hip joint. No fracture evident. Note that a subtle impaction injury could easily be obscured given this degree of osteoporosis. If there remains concern for potential proximal femur fracture on the left, correlation  with cross-sectional imaging may be of value in this circumstance. Electronically Signed   By: Bretta Bang III M.D.   On: 12/18/2017 18:51    ASSESSMENT / PLAN: Symptomatic bradycardia of unknown etiology s/p transvenous pacemaker placement Possible sepsis secondary to acute diverticulitis Diarrhea secondary to acute diverticulitis  Lactic acidosis  Hypokalemia  Elevated troponin likely demand ischemia in setting of bradycardia  P: Supplemental O2 for dyspnea and/or hypoxia  Trend troponin's  Echo pending  Continuous telemetry monitoring  Cardiology consulted appreciate input  Trend WBC and monitor fever curve Trend PCT and lactic acid  Follow cultures Cdiff and GI panels pending  Continue zosyn Trend BMP Replace electrolytes as indicated  Monitor UOP  VTE px: subq lovenox Trend CBC Monitor for s/sx of bleeding and transfuse for hgb <7  Sonda Rumble, AGNP  Pulmonary/Critical Care Pager [phone] (please enter 7 digits) PCCM Consult Pager [phone] (please enter 7 digits)

## 2017-12-18 NOTE — ED Notes (Signed)
Pt taken to xray. Advised xray not to sit pt up d/t temporary pacemaker. They verbalized understanding. Pt kept on monitor while going to xray.

## 2017-12-19 ENCOUNTER — Other Ambulatory Visit: Payer: Self-pay

## 2017-12-19 ENCOUNTER — Inpatient Hospital Stay
Admit: 2017-12-19 | Discharge: 2017-12-19 | Disposition: A | Discharge status: Home / Self Care | Payer: Medicare PPO | Attending: Cardiology | Admitting: Cardiology

## 2017-12-19 LAB — CBC
HEMATOCRIT: 34.1 % — AB (ref 35.0–47.0)
Hemoglobin: 11.1 g/dL — ABNORMAL LOW (ref 12.0–16.0)
MCH: 26 pg (ref 26.0–34.0)
MCHC: 32.5 g/dL (ref 32.0–36.0)
MCV: 80.1 fL (ref 80.0–100.0)
Platelets: 415 10*3/uL (ref 150–440)
RBC: 4.26 MIL/uL (ref 3.80–5.20)
RDW: 17 % — AB (ref 11.5–14.5)
WBC: 18.5 10*3/uL — AB (ref 3.6–11.0)

## 2017-12-19 LAB — BLOOD CULTURE ID PANEL (REFLEXED)

## 2017-12-19 LAB — GASTROINTESTINAL PANEL BY PCR, STOOL (REPLACES STOOL CULTURE)

## 2017-12-19 LAB — COMPREHENSIVE METABOLIC PANEL
ALT: 27 U/L (ref 14–54)
AST: 38 U/L (ref 15–41)
Albumin: 3 g/dL — ABNORMAL LOW (ref 3.5–5.0)
Alkaline Phosphatase: 75 U/L (ref 38–126)
Anion gap: 6 (ref 5–15)
BUN: 18 mg/dL (ref 6–20)
CHLORIDE: 104 mmol/L (ref 101–111)
CO2: 28 mmol/L (ref 22–32)
Calcium: 8.1 mg/dL — ABNORMAL LOW (ref 8.9–10.3)
Creatinine, Ser: 0.9 mg/dL (ref 0.44–1.00)
GFR calc Af Amer: >60 mL/min (ref >60)
GFR, EST NON AFRICAN AMERICAN: 58 mL/min — AB (ref >60)
Glucose, Bld: 125 mg/dL — ABNORMAL HIGH (ref 65–99)
POTASSIUM: 4 mmol/L (ref 3.5–5.1)
Sodium: 138 mmol/L (ref 135–145)
Total Bilirubin: 0.7 mg/dL (ref 0.3–1.2)
Total Protein: 7.1 g/dL (ref 6.5–8.1)

## 2017-12-19 LAB — HEMOGLOBIN A1C
HEMOGLOBIN A1C: 5.6 % (ref 4.8–5.6)
MEAN PLASMA GLUCOSE: 114.02 mg/dL

## 2017-12-19 LAB — C DIFFICILE QUICK SCREEN W PCR REFLEX
C DIFFICILE (CDIFF) INTERP: NOT DETECTED
C DIFFICILE (CDIFF) TOXIN: NEGATIVE
C Diff antigen: NEGATIVE

## 2017-12-19 LAB — ECHOCARDIOGRAM COMPLETE
Height: 61 in
WEIGHTICAEL: 3252.23 [oz_av]

## 2017-12-19 LAB — MRSA PCR SCREENING: MRSA BY PCR: NEGATIVE

## 2017-12-19 LAB — TROPONIN I: Troponin I: 0.62 ng/mL (ref <0.03)

## 2017-12-19 LAB — PROCALCITONIN: Procalcitonin: <0.1 ng/mL

## 2017-12-19 MED ORDER — OXYCODONE-ACETAMINOPHEN 5-325 MG PO TABS: 2.0000 | ORAL_TABLET | ORAL | Status: DC | PRN

## 2017-12-19 MED ORDER — MAGNESIUM SULFATE 2 GM/50ML IV SOLN
2.0000 g | Freq: Once | INTRAVENOUS | Status: AC
  Administered 2017-12-19: 2 g via INTRAVENOUS
  Filled 2017-12-19: qty 50

## 2017-12-19 MED ORDER — ENOXAPARIN SODIUM 40 MG/0.4ML ~~LOC~~ SOLN
40.0000 mg | SUBCUTANEOUS | Status: DC
  Administered 2017-12-20 – 2017-12-25 (×6): 40 mg via SUBCUTANEOUS
  Filled 2017-12-19 (×6): qty 0.4

## 2017-12-19 MED ORDER — OXYCODONE-ACETAMINOPHEN 5-325 MG PO TABS: 1.0000 | ORAL_TABLET | ORAL | Status: DC | PRN

## 2017-12-19 MED ORDER — SENNOSIDES-DOCUSATE SODIUM 8.6-50 MG PO TABS: 1.0000 | ORAL_TABLET | Freq: Every day | ORAL | Status: DC

## 2017-12-19 MED ORDER — METRONIDAZOLE IN NACL 5-0.79 MG/ML-% IV SOLN
500.0000 mg | Freq: Three times a day (TID) | INTRAVENOUS | Status: DC
  Administered 2017-12-19 – 2017-12-20 (×5): 500 mg via INTRAVENOUS
  Filled 2017-12-19 (×7): qty 100

## 2017-12-19 MED ORDER — CIPROFLOXACIN IN D5W 400 MG/200ML IV SOLN
400.0000 mg | Freq: Two times a day (BID) | INTRAVENOUS | Status: DC
  Administered 2017-12-19 – 2017-12-21 (×5): 400 mg via INTRAVENOUS
  Filled 2017-12-19 (×7): qty 200

## 2017-12-19 NOTE — Progress Notes (Signed)
PHARMACY - PHYSICIAN COMMUNICATION CRITICAL VALUE ALERT - BLOOD CULTURE IDENTIFICATION (BCID)  Results for orders placed or performed during the hospital encounter of 12/18/17  Blood Culture ID Panel (Reflexed) (Collected: 12/18/2017  4:54 PM)  Result Value Ref Range   Enterococcus species NOT DETECTED NOT DETECTED   Listeria monocytogenes NOT DETECTED NOT DETECTED   Staphylococcus species DETECTED (A) NOT DETECTED   Staphylococcus aureus NOT DETECTED NOT DETECTED   Methicillin resistance NOT DETECTED NOT DETECTED   Streptococcus species NOT DETECTED NOT DETECTED   Streptococcus agalactiae NOT DETECTED NOT DETECTED   Streptococcus pneumoniae NOT DETECTED NOT DETECTED   Streptococcus pyogenes NOT DETECTED NOT DETECTED   Acinetobacter baumannii NOT DETECTED NOT DETECTED   Enterobacteriaceae species NOT DETECTED NOT DETECTED   Enterobacter cloacae complex NOT DETECTED NOT DETECTED   Escherichia coli NOT DETECTED NOT DETECTED   Klebsiella oxytoca NOT DETECTED NOT DETECTED   Klebsiella pneumoniae NOT DETECTED NOT DETECTED   Proteus species NOT DETECTED NOT DETECTED   Serratia marcescens NOT DETECTED NOT DETECTED   Haemophilus influenzae NOT DETECTED NOT DETECTED   Neisseria meningitidis NOT DETECTED NOT DETECTED   Pseudomonas aeruginosa NOT DETECTED NOT DETECTED   Candida albicans NOT DETECTED NOT DETECTED   Candida glabrata NOT DETECTED NOT DETECTED   Candida krusei NOT DETECTED NOT DETECTED   Candida parapsilosis NOT DETECTED NOT DETECTED   Candida tropicalis NOT DETECTED NOT DETECTED    Name of physician (or Provider) Contacted:  Sonda Rumbleana Blakeney   Changes to prescribed antibiotics required:   No, most likely contaminant.   Carletta Feasel D 12/19/2017  9:54 PM

## 2017-12-19 NOTE — Progress Notes (Signed)
*  PRELIMINARY RESULTS* Echocardiogram 2D Echocardiogram has been performed.  Cristela BlueHege, Keren Alverio 12/19/2017, 3:46 PM

## 2017-12-19 NOTE — Progress Notes (Signed)
Neuro: Pt remains sedated and unable to follow commands at this time. Neuro exam WNL. Will continue to monitor.    Respiratory: Pt remains on 6L o2 via [place]. O2 sats WN at this time, but quickly desats when o2 removed. Lungs clear and diminished.     Cardiovascular: Pt remains in sinus rhythm with PVCs occasionally. BP WNL,  generalized edema, and pt afebrile throughout shift.    GI/GU: Pt with foley in place with low urine output. Pt with 300ml of urine output throughout shift. Last BM today.  Pt with poor PO intake and sue to decreased appetite.   Skin: Pt with MAD to perineum, barrier cream applied. No s/s of pressure ulcers at this time. Pt continues to be a EcologistQ2hr turn.   Pain: Pt with c/o back and chest pain from compressions. Tramadol given and adequate relief reported.   Events: NO acute events throughout shift. Pts plan of care to continue with current regimen, Cardiology turned down transvenous pacer today to assess if any additional bradycardia occurs. Md said that plan would be to remove pacer tomorrow it pt tolerates.  Family updated and no further questions at this time.

## 2017-12-19 NOTE — Progress Notes (Signed)
Patient's temporary pacer settings are -  Rate of 30, Output of 25 and Sensing of 5.

## 2017-12-19 NOTE — Progress Notes (Signed)
Pacemaker parameters changed to:  Rate 60 Output 25 mV Sense 5 V Underlying native rate is 89 bpm

## 2017-12-19 NOTE — Progress Notes (Signed)
Patient Name: Sydney Mitchell Date of Encounter: 12/19/2017  Hospital Problem List     Active Problems:   Bradycardia    Patient Profile     Pt with admission for diarrhea and chb.  S/P temp pacer for chb. Currently in nsr. Not needing pacing.   Subjective   Feels better. Stillwtih some diarrhea.  Inpatient Medications    . enoxaparin (LOVENOX) injection  40 mg Subcutaneous Q12H  . sodium chloride flush  3 mL Intravenous Q12H    Vital Signs    Vitals:   12/19/17 0300 12/19/17 0400 12/19/17 0500 12/19/17 0600  BP: 133/69 (!) 133/57 (!) 125/47 123/61  Pulse: 97 90 85 83  Resp: 19 (!) 32 (!) 31 (!) 25  Temp:  98.2 F (36.8 C)    TempSrc:  Axillary    SpO2: 92% (!) 86% (!) 78% 94%  Weight:      Height:        Intake/Output Summary (Last 24 hours) at 12/19/2017 0848 Last data filed at 12/19/2017 0540 Gross per 24 hour  Intake 129 ml  Output 500 ml  Net -371 ml   Filed Weights   12/18/17 1619 12/18/17 2100  Weight: 114.4 kg (252 lb 3.2 oz) 92.2 kg (203 lb 4.2 oz)    Physical Exam    GEN: Well nourished, well developed, in no acute distress.  HEENT: normal.  Neck: Supple, no JVD, carotid bruits, or masses. Cardiac: RRR, no murmurs, rubs, or gallops. No clubbing, cyanosis, edema.  Radials/DP/PT 2+ and equal bilaterally.  Respiratory:  Respirations regular and unlabored, clear to auscultation bilaterally. GI: Soft, nontender, nondistended, BS + x 4. MS: no deformity or atrophy. Skin: warm and dry, no rash. Neuro:  Strength and sensation are intact. Psych: Normal affect.  Labs    CBC Recent Labs    12/18/17 1618 12/19/17 0407  WBC 22.9* 18.5*  HGB 11.6* 11.1*  HCT 37.0 34.1*  MCV 82.1 80.1  PLT 513* 415   Basic Metabolic Panel Recent Labs    96/12/5402/16/19 1618 12/18/17 2139 12/19/17 0407  NA 138  --  138  K 3.1*  --  4.0  CL 99*  --  104  CO2 26  --  28  GLUCOSE 220*  --  125*  BUN 18  --  18  CREATININE 0.96  --  0.90  CALCIUM 8.8*  --  8.1*   MG  --  1.6*  --    Liver Function Tests Recent Labs    12/19/17 0407  AST 38  ALT 27  ALKPHOS 75  BILITOT 0.7  PROT 7.1  ALBUMIN 3.0*   No results for input(s): LIPASE, AMYLASE in the last 72 hours. Cardiac Enzymes Recent Labs    12/18/17 1618 12/18/17 2139 12/19/17 0407  TROPONINI 0.06* 0.56* 0.62*   BNP No results for input(s): BNP in the last 72 hours. D-Dimer No results for input(s): DDIMER in the last 72 hours. Hemoglobin A1C Recent Labs    12/18/17 2139  HGBA1C 5.6   Fasting Lipid Panel No results for input(s): CHOL, HDL, LDLCALC, TRIG, CHOLHDL, LDLDIRECT in the last 72 hours. Thyroid Function Tests No results for input(s): TSH, T4TOTAL, T3FREE, THYROIDAB in the last 72 hours.  Invalid input(s): FREET3  Telemetry    Currently nsr.   ECG    nsr  Radiology    Ct Head Wo Contrast  Result Date: 12/18/2017 CLINICAL DATA:  Fall. Dizziness. Bradycardia and brief CPR. Initial encounter. EXAM: CT HEAD  WITHOUT CONTRAST CT CERVICAL SPINE WITHOUT CONTRAST TECHNIQUE: Multidetector CT imaging of the head and cervical spine was performed following the standard protocol without intravenous contrast. Multiplanar CT image reconstructions of the cervical spine were also generated. COMPARISON:  None. FINDINGS: CT HEAD FINDINGS Brain: There is no evidence of acute infarct, intracranial hemorrhage, mass, midline shift, or extra-axial fluid collection. Subcortical and periventricular white matter hypodensities are nonspecific but compatible with mild-to-moderate chronic small vessel ischemic disease. There is mild-to-moderate cerebral atrophy. Vascular: Calcified atherosclerosis at the skull base. No hyperdense vessel. Skull: No fracture or focal osseous lesion. Sinuses/Orbits: Visualized paranasal sinuses and mastoid air cells are clear. Orbits are unremarkable. Other: Small right frontal scalp hematoma. CT CERVICAL SPINE FINDINGS Alignment: Trace anterolisthesis of C2 on C3 and  trace retrolisthesis of C4 on C5, likely degenerative. Skull base and vertebrae: No acute fracture or suspicious osseous lesion. Soft tissues and spinal canal: No prevertebral fluid or swelling. No visible canal hematoma. Disc levels: Diffuse cervical disc degeneration, most advanced at C3-4 and C4-5 where there is severe disc space narrowing, endplate sclerosis, and endplate osteophyte formation. Mild-to-moderate spinal stenosis at C4-5. Moderate to severe left facet arthrosis at C2-3 with moderate facet arthrosis at other levels. Advanced right-sided neural foraminal stenosis from C3-4-C5-6. Disc calcification at C7-T1. Upper chest: Clear lung apices. Other: Extensive calcified atherosclerosis at the carotid bifurcations. IMPRESSION: 1. No evidence of acute intracranial abnormality. 2. Small right frontal scalp hematoma. 3. Mild-to-moderate chronic small vessel ischemic disease and cerebral atrophy. 4. No cervical spine fracture. 5. Advanced cervical disc degeneration. Electronically Signed   By: Sebastian Ache M.D.   On: 12/18/2017 18:22   Ct Chest W Contrast  Result Date: 12/18/2017 CLINICAL DATA:  Bradycardic. CPR for pulseless. Recent diarrhea. Elevated white blood cell count EXAM: CT CHEST, ABDOMEN, AND PELVIS WITH CONTRAST TECHNIQUE: Multidetector CT imaging of the chest, abdomen and pelvis was performed following the standard protocol during bolus administration of intravenous contrast. CONTRAST:  <See Chart> OMNIPAQUE IOHEXOL 300 MG/ML SOLN, ISOVUE-300 IOPAMIDOL (ISOVUE-300) INJECTION 61% COMPARISON:  Chest radiograph 12/18/2017 FINDINGS: CT CHEST FINDINGS Cardiovascular: The pacing wires in the RIGHT atrium and ventricle from a LEFT groin approach no pericardial effusion. Coronary calcifications present no central pulmonary embolism. Mediastinum/Nodes: No axillary or supraclavicular adenopathy. No mediastinal hilar adenopathy. No pericardial effusion. Esophagus normal. Lungs/Pleura: RIGHT lower  lobe atelectasis. Mildly elevated RIGHT hemidiaphragm. No airspace disease. No pneumothorax Musculoskeletal: No aggressive osseous lesion CT ABDOMEN PELVIS FINDING Hepatobiliary: No focal hepatic lesion. Postcholecystectomy. No biliary dilatation. Pancreas: Pancreas is normal. No ductal dilatation. No pancreatic inflammation. Spleen: Normal spleen Adrenals/urinary tract: Adrenal glands normal. Low-attenuation cysts within the RIGHT renal cortex. Ureters normal. Bladder wall trabeculation. Stomach/Bowel: Stomach, duodenum small-bowel normal. Appendix cecum normal. Ascending transverse colon normal. There are several diverticula of the transverse colon descending colon. Within the distal descending colon proximal sigmoid colon there is a long segment of bowel wall thickening and pericolonic inflammation. There multiple diverticula through this region. No macro perforation or abscess is identified. Or distal sigmoid colon and rectum are normal. The sigmoid colon inflammation extends approximately 12 cm (for example image 93/3) Vascular/Lymphatic: Abdominal aorta is normal caliber with atherosclerotic calcification. There is no retroperitoneal or periportal lymphadenopathy. No pelvic lymphadenopathy. The celiac trunk, SMA and IMA are patent. Reproductive: Post hysterectomy Other: No free fluid. Musculoskeletal: No aggressive osseous lesion. There is chronic elevation of the pressure there is significant elevation of the LEFT hemidiaphragm. IMPRESSION: Chest Impression: 1. Dense RIGHT basilar atelectasis.  Atelectasis is presumably related to the markedly elevated RIGHT hemidiaphragm. 2.  Pacing leads in the RIGHT heart.  No pericardial effusion Abdomen / Pelvis Impression: 1. Long 12 cm segment of sigmoid colon inflammation in region of multiple diverticula is most consistent with acute diverticulitis. Segmental colitis would be a secondary consideration. Differential for colitis would include ischemia and inflammatory  bowel disease. 2. No perforation or abscess. 3. No bladder wall trabeculation Electronically Signed   By: Genevive Bi M.D.   On: 12/18/2017 21:04   Ct Cervical Spine Wo Contrast  Result Date: 12/18/2017 CLINICAL DATA:  Fall. Dizziness. Bradycardia and brief CPR. Initial encounter. EXAM: CT HEAD WITHOUT CONTRAST CT CERVICAL SPINE WITHOUT CONTRAST TECHNIQUE: Multidetector CT imaging of the head and cervical spine was performed following the standard protocol without intravenous contrast. Multiplanar CT image reconstructions of the cervical spine were also generated. COMPARISON:  None. FINDINGS: CT HEAD FINDINGS Brain: There is no evidence of acute infarct, intracranial hemorrhage, mass, midline shift, or extra-axial fluid collection. Subcortical and periventricular white matter hypodensities are nonspecific but compatible with mild-to-moderate chronic small vessel ischemic disease. There is mild-to-moderate cerebral atrophy. Vascular: Calcified atherosclerosis at the skull base. No hyperdense vessel. Skull: No fracture or focal osseous lesion. Sinuses/Orbits: Visualized paranasal sinuses and mastoid air cells are clear. Orbits are unremarkable. Other: Small right frontal scalp hematoma. CT CERVICAL SPINE FINDINGS Alignment: Trace anterolisthesis of C2 on C3 and trace retrolisthesis of C4 on C5, likely degenerative. Skull base and vertebrae: No acute fracture or suspicious osseous lesion. Soft tissues and spinal canal: No prevertebral fluid or swelling. No visible canal hematoma. Disc levels: Diffuse cervical disc degeneration, most advanced at C3-4 and C4-5 where there is severe disc space narrowing, endplate sclerosis, and endplate osteophyte formation. Mild-to-moderate spinal stenosis at C4-5. Moderate to severe left facet arthrosis at C2-3 with moderate facet arthrosis at other levels. Advanced right-sided neural foraminal stenosis from C3-4-C5-6. Disc calcification at C7-T1. Upper chest: Clear lung  apices. Other: Extensive calcified atherosclerosis at the carotid bifurcations. IMPRESSION: 1. No evidence of acute intracranial abnormality. 2. Small right frontal scalp hematoma. 3. Mild-to-moderate chronic small vessel ischemic disease and cerebral atrophy. 4. No cervical spine fracture. 5. Advanced cervical disc degeneration. Electronically Signed   By: Sebastian Ache M.D.   On: 12/18/2017 18:22   Ct Abdomen Pelvis W Contrast  Result Date: 12/18/2017 CLINICAL DATA:  Bradycardic. CPR for pulseless. Recent diarrhea. Elevated white blood cell count EXAM: CT CHEST, ABDOMEN, AND PELVIS WITH CONTRAST TECHNIQUE: Multidetector CT imaging of the chest, abdomen and pelvis was performed following the standard protocol during bolus administration of intravenous contrast. CONTRAST:  <See Chart> OMNIPAQUE IOHEXOL 300 MG/ML SOLN, ISOVUE-300 IOPAMIDOL (ISOVUE-300) INJECTION 61% COMPARISON:  Chest radiograph 12/18/2017 FINDINGS: CT CHEST FINDINGS Cardiovascular: The pacing wires in the RIGHT atrium and ventricle from a LEFT groin approach no pericardial effusion. Coronary calcifications present no central pulmonary embolism. Mediastinum/Nodes: No axillary or supraclavicular adenopathy. No mediastinal hilar adenopathy. No pericardial effusion. Esophagus normal. Lungs/Pleura: RIGHT lower lobe atelectasis. Mildly elevated RIGHT hemidiaphragm. No airspace disease. No pneumothorax Musculoskeletal: No aggressive osseous lesion CT ABDOMEN PELVIS FINDING Hepatobiliary: No focal hepatic lesion. Postcholecystectomy. No biliary dilatation. Pancreas: Pancreas is normal. No ductal dilatation. No pancreatic inflammation. Spleen: Normal spleen Adrenals/urinary tract: Adrenal glands normal. Low-attenuation cysts within the RIGHT renal cortex. Ureters normal. Bladder wall trabeculation. Stomach/Bowel: Stomach, duodenum small-bowel normal. Appendix cecum normal. Ascending transverse colon normal. There are several diverticula of the  transverse colon descending colon. Within  the distal descending colon proximal sigmoid colon there is a long segment of bowel wall thickening and pericolonic inflammation. There multiple diverticula through this region. No macro perforation or abscess is identified. Or distal sigmoid colon and rectum are normal. The sigmoid colon inflammation extends approximately 12 cm (for example image 93/3) Vascular/Lymphatic: Abdominal aorta is normal caliber with atherosclerotic calcification. There is no retroperitoneal or periportal lymphadenopathy. No pelvic lymphadenopathy. The celiac trunk, SMA and IMA are patent. Reproductive: Post hysterectomy Other: No free fluid. Musculoskeletal: No aggressive osseous lesion. There is chronic elevation of the pressure there is significant elevation of the LEFT hemidiaphragm. IMPRESSION: Chest Impression: 1. Dense RIGHT basilar atelectasis. Atelectasis is presumably related to the markedly elevated RIGHT hemidiaphragm. 2.  Pacing leads in the RIGHT heart.  No pericardial effusion Abdomen / Pelvis Impression: 1. Long 12 cm segment of sigmoid colon inflammation in region of multiple diverticula is most consistent with acute diverticulitis. Segmental colitis would be a secondary consideration. Differential for colitis would include ischemia and inflammatory bowel disease. 2. No perforation or abscess. 3. No bladder wall trabeculation Electronically Signed   By: Genevive Bi M.D.   On: 12/18/2017 21:04   Dg Chest Portable 1 View  Result Date: 12/18/2017 CLINICAL DATA:  Pain following fall EXAM: PORTABLE CHEST 1 VIEW COMPARISON:  None. FINDINGS: There is marked elevation of the right hemidiaphragm. There is atelectasis in the right base. The lungs elsewhere clear. Heart size and pulmonary vascularity are normal. No adenopathy. There is aortic atherosclerosis. Bones are osteoporotic. There is a total shoulder replacement on the left. No pneumothorax. IMPRESSION: Marked elevation of  the right hemidiaphragm with right base atelectasis. Lungs elsewhere clear. Heart size normal. No pneumothorax. There is aortic atherosclerosis. Aortic Atherosclerosis (ICD10-I70.0). Electronically Signed   By: Bretta Bang III M.D.   On: 12/18/2017 18:52   Dg Hip Unilat W Or Wo Pelvis 2-3 Views Left  Result Date: 12/18/2017 CLINICAL DATA:  Pain following fall EXAM: DG HIP (WITH OR WITHOUT PELVIS) 2-3V LEFT COMPARISON:  None. FINDINGS: Frontal pelvis as well as frontal and lateral left hip images were obtained. Bones are diffusely osteoporotic. No fracture or dislocation is appreciable. There is marked narrowing of the left hip joint with bony eburnation on both sides of the left hip joint. There is mild narrowing of the right hip joint. No erosive changes. IMPRESSION: Diffuse osteoporotic diffuse osteoporosis. Marked osteoarthritis in the left hip joint. Milder osteoarthritic change in the right hip joint. No fracture evident. Note that a subtle impaction injury could easily be obscured given this degree of osteoporosis. If there remains concern for potential proximal femur fracture on the left, correlation with cross-sectional imaging may be of value in this circumstance. Electronically Signed   By: Bretta Bang III M.D.   On: 12/18/2017 18:51    Assessment & Plan    CHB-unclear etiology but appears to have had a component of electrolyte abnormality as patient has improved with potassium repletion. Does not appear ischemic at present. Mild troponin elevation however patient has received some cpr. Will consider ischemic workup prior to discharge. Have turned pacer to back up at 60. Currently in nsr and not pacing. At this point will hold on ppm and follow rhythm.   Diarrhea-cultures pending. C diff neg.   Elevated troponin-will review echo when available.   Signed, Darlin Priestly Aprile Dickenson MD 12/19/2017, 8:48 AM  Pager: [phone]

## 2017-12-19 NOTE — Progress Notes (Signed)
[facility] - [place] at [facility]   PATIENT NAME: Sydney MonksCarol Mitchell    MR#:  [id number]  DATE OF BIRTH:  06/30/1934  SUBJECTIVE:  CHIEF COMPLAINT:   Chief Complaint  Patient presents with  . Bradycardia  . Fall   Patient continues to have arthritis pain.  Feels weak.  REVIEW OF SYSTEMS:    Review of Systems  Constitutional: Positive for malaise/fatigue. Negative for chills and fever.  HENT: Negative for sore throat.   Eyes: Negative for blurred vision, double vision and pain.  Respiratory: Positive for cough. Negative for hemoptysis, shortness of breath and wheezing.   Cardiovascular: Negative for chest pain, palpitations, orthopnea and leg swelling.  Gastrointestinal: Positive for abdominal pain and diarrhea. Negative for constipation, heartburn, nausea and vomiting.  Genitourinary: Negative for dysuria and hematuria.  Musculoskeletal: Negative for back pain and joint pain.  Skin: Negative for rash.  Neurological: Negative for sensory change, speech change, focal weakness and headaches.  Endo/Heme/Allergies: Does not bruise/bleed easily.  Psychiatric/Behavioral: Negative for depression. The patient is not nervous/anxious.     DRUG ALLERGIES:  No Known Allergies  VITALS:  Blood pressure 123/61, pulse 83, temperature 98.2 F (36.8 C), temperature source Axillary, resp. rate (!) 25, height 5\' 1"  (1.549 m), weight 92.2 kg (203 lb 4.2 oz), SpO2 94 %.  PHYSICAL EXAMINATION:   Physical Exam  GENERAL:  84 y.o.-year-old patient lying in the bed with no acute distress.  EYES: Pupils equal, round, reactive to light and accommodation. No scleral icterus. Extraocular muscles intact.  HEENT: Head atraumatic, normocephalic. Oropharynx and nasopharynx clear.  NECK:  Supple, no jugular venous distention. No thyroid enlargement, no tenderness.  LUNGS: Normal breath sounds bilaterally, no wheezing, rales, rhonchi. No use of accessory muscles of respiration.   CARDIOVASCULAR: S1, S2 normal. No murmurs, rubs, or gallops.  ABDOMEN: Soft, nontender, nondistended. Bowel sounds present. No organomegaly or mass.  EXTREMITIES: No cyanosis, clubbing or edema b/l.    NEUROLOGIC: Cranial nerves II through XII are intact. No focal Motor or sensory deficits b/l.   PSYCHIATRIC: The patient is alert and oriented x 3.  SKIN: No obvious rash, lesion, or ulcer.   LABORATORY PANEL:   CBC Recent Labs  Lab 12/19/17 0407  WBC 18.5*  HGB 11.1*  HCT 34.1*  PLT 415   ------------------------------------------------------------------------------------------------------------------ Chemistries  Recent Labs  Lab 12/18/17 2139 12/19/17 0407  NA  --  138  K  --  4.0  CL  --  104  CO2  --  28  GLUCOSE  --  125*  BUN  --  18  CREATININE  --  0.90  CALCIUM  --  8.1*  MG 1.6*  --   AST  --  38  ALT  --  27  ALKPHOS  --  75  BILITOT  --  0.7   ------------------------------------------------------------------------------------------------------------------  Cardiac Enzymes Recent Labs  Lab 12/19/17 0407  TROPONINI 0.62*   ------------------------------------------------------------------------------------------------------------------  RADIOLOGY:  Ct Head Wo Contrast  Result Date: 12/18/2017 CLINICAL DATA:  Fall. Dizziness. Bradycardia and brief CPR. Initial encounter. EXAM: CT HEAD WITHOUT CONTRAST CT CERVICAL SPINE WITHOUT CONTRAST TECHNIQUE: Multidetector CT imaging of the head and cervical spine was performed following the standard protocol without intravenous contrast. Multiplanar CT image reconstructions of the cervical spine were also generated. COMPARISON:  None. FINDINGS: CT HEAD FINDINGS Brain: There is no evidence of acute infarct, intracranial hemorrhage, mass, midline shift, or extra-axial fluid collection. Subcortical and periventricular white matter hypodensities are nonspecific but  compatible with mild-to-moderate chronic small vessel  ischemic disease. There is mild-to-moderate cerebral atrophy. Vascular: Calcified atherosclerosis at the skull base. No hyperdense vessel. Skull: No fracture or focal osseous lesion. Sinuses/Orbits: Visualized paranasal sinuses and mastoid air cells are clear. Orbits are unremarkable. Other: Small right frontal scalp hematoma. CT CERVICAL SPINE FINDINGS Alignment: Trace anterolisthesis of C2 on C3 and trace retrolisthesis of C4 on C5, likely degenerative. Skull base and vertebrae: No acute fracture or suspicious osseous lesion. Soft tissues and spinal canal: No prevertebral fluid or swelling. No visible canal hematoma. Disc levels: Diffuse cervical disc degeneration, most advanced at C3-4 and C4-5 where there is severe disc space narrowing, endplate sclerosis, and endplate osteophyte formation. Mild-to-moderate spinal stenosis at C4-5. Moderate to severe left facet arthrosis at C2-3 with moderate facet arthrosis at other levels. Advanced right-sided neural foraminal stenosis from C3-4-C5-6. Disc calcification at C7-T1. Upper chest: Clear lung apices. Other: Extensive calcified atherosclerosis at the carotid bifurcations. IMPRESSION: 1. No evidence of acute intracranial abnormality. 2. Small right frontal scalp hematoma. 3. Mild-to-moderate chronic small vessel ischemic disease and cerebral atrophy. 4. No cervical spine fracture. 5. Advanced cervical disc degeneration. Electronically Signed   By: Sebastian Ache M.D.   On: 12/18/2017 18:22   Ct Chest W Contrast  Result Date: 12/18/2017 CLINICAL DATA:  Bradycardic. CPR for pulseless. Recent diarrhea. Elevated white blood cell count EXAM: CT CHEST, ABDOMEN, AND PELVIS WITH CONTRAST TECHNIQUE: Multidetector CT imaging of the chest, abdomen and pelvis was performed following the standard protocol during bolus administration of intravenous contrast. CONTRAST:  <See Chart> OMNIPAQUE IOHEXOL 300 MG/ML SOLN, ISOVUE-300 IOPAMIDOL (ISOVUE-300) INJECTION 61% COMPARISON:   Chest radiograph 12/18/2017 FINDINGS: CT CHEST FINDINGS Cardiovascular: The pacing wires in the RIGHT atrium and ventricle from a LEFT groin approach no pericardial effusion. Coronary calcifications present no central pulmonary embolism. Mediastinum/Nodes: No axillary or supraclavicular adenopathy. No mediastinal hilar adenopathy. No pericardial effusion. Esophagus normal. Lungs/Pleura: RIGHT lower lobe atelectasis. Mildly elevated RIGHT hemidiaphragm. No airspace disease. No pneumothorax Musculoskeletal: No aggressive osseous lesion CT ABDOMEN PELVIS FINDING Hepatobiliary: No focal hepatic lesion. Postcholecystectomy. No biliary dilatation. Pancreas: Pancreas is normal. No ductal dilatation. No pancreatic inflammation. Spleen: Normal spleen Adrenals/urinary tract: Adrenal glands normal. Low-attenuation cysts within the RIGHT renal cortex. Ureters normal. Bladder wall trabeculation. Stomach/Bowel: Stomach, duodenum small-bowel normal. Appendix cecum normal. Ascending transverse colon normal. There are several diverticula of the transverse colon descending colon. Within the distal descending colon proximal sigmoid colon there is a long segment of bowel wall thickening and pericolonic inflammation. There multiple diverticula through this region. No macro perforation or abscess is identified. Or distal sigmoid colon and rectum are normal. The sigmoid colon inflammation extends approximately 12 cm (for example image 93/3) Vascular/Lymphatic: Abdominal aorta is normal caliber with atherosclerotic calcification. There is no retroperitoneal or periportal lymphadenopathy. No pelvic lymphadenopathy. The celiac trunk, SMA and IMA are patent. Reproductive: Post hysterectomy Other: No free fluid. Musculoskeletal: No aggressive osseous lesion. There is chronic elevation of the pressure there is significant elevation of the LEFT hemidiaphragm. IMPRESSION: Chest Impression: 1. Dense RIGHT basilar atelectasis. Atelectasis is  presumably related to the markedly elevated RIGHT hemidiaphragm. 2.  Pacing leads in the RIGHT heart.  No pericardial effusion Abdomen / Pelvis Impression: 1. Long 12 cm segment of sigmoid colon inflammation in region of multiple diverticula is most consistent with acute diverticulitis. Segmental colitis would be a secondary consideration. Differential for colitis would include ischemia and inflammatory bowel disease. 2. No perforation or abscess. 3.  No bladder wall trabeculation Electronically Signed   By: Genevive Bi M.D.   On: 12/18/2017 21:04   Ct Cervical Spine Wo Contrast  Result Date: 12/18/2017 CLINICAL DATA:  Fall. Dizziness. Bradycardia and brief CPR. Initial encounter. EXAM: CT HEAD WITHOUT CONTRAST CT CERVICAL SPINE WITHOUT CONTRAST TECHNIQUE: Multidetector CT imaging of the head and cervical spine was performed following the standard protocol without intravenous contrast. Multiplanar CT image reconstructions of the cervical spine were also generated. COMPARISON:  None. FINDINGS: CT HEAD FINDINGS Brain: There is no evidence of acute infarct, intracranial hemorrhage, mass, midline shift, or extra-axial fluid collection. Subcortical and periventricular white matter hypodensities are nonspecific but compatible with mild-to-moderate chronic small vessel ischemic disease. There is mild-to-moderate cerebral atrophy. Vascular: Calcified atherosclerosis at the skull base. No hyperdense vessel. Skull: No fracture or focal osseous lesion. Sinuses/Orbits: Visualized paranasal sinuses and mastoid air cells are clear. Orbits are unremarkable. Other: Small right frontal scalp hematoma. CT CERVICAL SPINE FINDINGS Alignment: Trace anterolisthesis of C2 on C3 and trace retrolisthesis of C4 on C5, likely degenerative. Skull base and vertebrae: No acute fracture or suspicious osseous lesion. Soft tissues and spinal canal: No prevertebral fluid or swelling. No visible canal hematoma. Disc levels: Diffuse cervical  disc degeneration, most advanced at C3-4 and C4-5 where there is severe disc space narrowing, endplate sclerosis, and endplate osteophyte formation. Mild-to-moderate spinal stenosis at C4-5. Moderate to severe left facet arthrosis at C2-3 with moderate facet arthrosis at other levels. Advanced right-sided neural foraminal stenosis from C3-4-C5-6. Disc calcification at C7-T1. Upper chest: Clear lung apices. Other: Extensive calcified atherosclerosis at the carotid bifurcations. IMPRESSION: 1. No evidence of acute intracranial abnormality. 2. Small right frontal scalp hematoma. 3. Mild-to-moderate chronic small vessel ischemic disease and cerebral atrophy. 4. No cervical spine fracture. 5. Advanced cervical disc degeneration. Electronically Signed   By: Sebastian Ache M.D.   On: 12/18/2017 18:22   Ct Abdomen Pelvis W Contrast  Result Date: 12/18/2017 CLINICAL DATA:  Bradycardic. CPR for pulseless. Recent diarrhea. Elevated white blood cell count EXAM: CT CHEST, ABDOMEN, AND PELVIS WITH CONTRAST TECHNIQUE: Multidetector CT imaging of the chest, abdomen and pelvis was performed following the standard protocol during bolus administration of intravenous contrast. CONTRAST:  <See Chart> OMNIPAQUE IOHEXOL 300 MG/ML SOLN, ISOVUE-300 IOPAMIDOL (ISOVUE-300) INJECTION 61% COMPARISON:  Chest radiograph 12/18/2017 FINDINGS: CT CHEST FINDINGS Cardiovascular: The pacing wires in the RIGHT atrium and ventricle from a LEFT groin approach no pericardial effusion. Coronary calcifications present no central pulmonary embolism. Mediastinum/Nodes: No axillary or supraclavicular adenopathy. No mediastinal hilar adenopathy. No pericardial effusion. Esophagus normal. Lungs/Pleura: RIGHT lower lobe atelectasis. Mildly elevated RIGHT hemidiaphragm. No airspace disease. No pneumothorax Musculoskeletal: No aggressive osseous lesion CT ABDOMEN PELVIS FINDING Hepatobiliary: No focal hepatic lesion. Postcholecystectomy. No biliary  dilatation. Pancreas: Pancreas is normal. No ductal dilatation. No pancreatic inflammation. Spleen: Normal spleen Adrenals/urinary tract: Adrenal glands normal. Low-attenuation cysts within the RIGHT renal cortex. Ureters normal. Bladder wall trabeculation. Stomach/Bowel: Stomach, duodenum small-bowel normal. Appendix cecum normal. Ascending transverse colon normal. There are several diverticula of the transverse colon descending colon. Within the distal descending colon proximal sigmoid colon there is a long segment of bowel wall thickening and pericolonic inflammation. There multiple diverticula through this region. No macro perforation or abscess is identified. Or distal sigmoid colon and rectum are normal. The sigmoid colon inflammation extends approximately 12 cm (for example image 93/3) Vascular/Lymphatic: Abdominal aorta is normal caliber with atherosclerotic calcification. There is no retroperitoneal or periportal lymphadenopathy. No pelvic  lymphadenopathy. The celiac trunk, SMA and IMA are patent. Reproductive: Post hysterectomy Other: No free fluid. Musculoskeletal: No aggressive osseous lesion. There is chronic elevation of the pressure there is significant elevation of the LEFT hemidiaphragm. IMPRESSION: Chest Impression: 1. Dense RIGHT basilar atelectasis. Atelectasis is presumably related to the markedly elevated RIGHT hemidiaphragm. 2.  Pacing leads in the RIGHT heart.  No pericardial effusion Abdomen / Pelvis Impression: 1. Long 12 cm segment of sigmoid colon inflammation in region of multiple diverticula is most consistent with acute diverticulitis. Segmental colitis would be a secondary consideration. Differential for colitis would include ischemia and inflammatory bowel disease. 2. No perforation or abscess. 3. No bladder wall trabeculation Electronically Signed   By: Genevive Bi M.D.   On: 12/18/2017 21:04   Dg Chest Portable 1 View  Result Date: 12/18/2017 CLINICAL DATA:  Pain following  fall EXAM: PORTABLE CHEST 1 VIEW COMPARISON:  None. FINDINGS: There is marked elevation of the right hemidiaphragm. There is atelectasis in the right base. The lungs elsewhere clear. Heart size and pulmonary vascularity are normal. No adenopathy. There is aortic atherosclerosis. Bones are osteoporotic. There is a total shoulder replacement on the left. No pneumothorax. IMPRESSION: Marked elevation of the right hemidiaphragm with right base atelectasis. Lungs elsewhere clear. Heart size normal. No pneumothorax. There is aortic atherosclerosis. Aortic Atherosclerosis (ICD10-I70.0). Electronically Signed   By: Bretta Bang III M.D.   On: 12/18/2017 18:52   Dg Hip Unilat W Or Wo Pelvis 2-3 Views Left  Result Date: 12/18/2017 CLINICAL DATA:  Pain following fall EXAM: DG HIP (WITH OR WITHOUT PELVIS) 2-3V LEFT COMPARISON:  None. FINDINGS: Frontal pelvis as well as frontal and lateral left hip images were obtained. Bones are diffusely osteoporotic. No fracture or dislocation is appreciable. There is marked narrowing of the left hip joint with bony eburnation on both sides of the left hip joint. There is mild narrowing of the right hip joint. No erosive changes. IMPRESSION: Diffuse osteoporotic diffuse osteoporosis. Marked osteoarthritis in the left hip joint. Milder osteoarthritic change in the right hip joint. No fracture evident. Note that a subtle impaction injury could easily be obscured given this degree of osteoporosis. If there remains concern for potential proximal femur fracture on the left, correlation with cross-sectional imaging may be of value in this circumstance. Electronically Signed   By: Bretta Bang III M.D.   On: 12/18/2017 18:51     ASSESSMENT AND PLAN:   *Bradycardia.  Etiology unclear.  Could be due to electrolyte imbalances with hypokalemia and hypomagnesemia Patient does not take any medications.    Repleted.  Today patient is not needing pacing. Appreciate cardiology  input.  *Sepsis secondary to colitis.  On IV antibiotics. C. difficile and stool PCR negative  *Osteoarthritis.  Pain medications as needed.  *DVT prophylaxis with Lovenox   All the records are reviewed and case discussed with Care Management/Social Worker Management plans discussed with the patient, family and they are in agreement.  CODE STATUS: FULL CODE  DVT Prophylaxis: SCDs  TOTAL TIME TAKING CARE OF THIS PATIENT: 30 minutes.   POSSIBLE D/C IN 1-2 DAYS, DEPENDING ON CLINICAL CONDITION.  Orie Fisherman M.D on 12/19/2017 at 11:13 AM  Between 7am to 6pm - Pager - [phone]  After 6pm go to [email or web address] - password [email or web address]  [facility]  Office  [phone]  CC: Primary care physician; Patient, No Pcp Per  Note: This dictation was prepared with Dragon dictation along with smaller phrase technology. Any  transcriptional errors that result from this process are unintentional.

## 2017-12-19 NOTE — Progress Notes (Signed)
0300- spoke with Remo Lipps in lab to verify results of C-Diff testing. Results for C-Diff in computer state negative, however the PCR test for a GI bleed stated it was detected. Per lab the PCR test does not accurately read the results as the specific C-Diff kit that resulted in negative. Patient does not have C-Diff.   0320- Dr. Ubaldo Glassing paged and phone call returned regarding sporadic pacing in the temporary pacer. No new orders received at this time as it is set to be asynchronous.

## 2017-12-19 NOTE — Progress Notes (Addendum)
Pharmacy Electrolyte Monitoring Consult:  Pharmacy consulted to assist in monitoring and replacing electrolytes in this 84 y.o. female admitted on 12/18/2017 with Bradycardia and Fall  Patient received potassium 10mEq IV Q1hr x 3 doses overnight.   Labs:  Sodium (mmol/L)  Date Value  12/19/2017 138   Potassium (mmol/L)  Date Value  12/19/2017 4.0   Magnesium (mg/dL)  Date Value  16/10/960404/16/2019 1.6 (L)   Calcium (mg/dL)  Date Value  54/09/811904/17/2019 8.1 (L)   Albumin (g/dL)  Date Value  14/78/295604/17/2019 3.0 (L)    Plan: No replacement warranted at this time. Will recheck electrolytes with am labs.   Pharmacy will continue to monitor and adjust per consult.   Simpson,Michael L 12/19/2017 10:16 PM

## 2017-12-20 LAB — BASIC METABOLIC PANEL
ANION GAP: 5 (ref 5–15)
BUN: 20 mg/dL (ref 6–20)
CALCIUM: 8.3 mg/dL — AB (ref 8.9–10.3)
CHLORIDE: 105 mmol/L (ref 101–111)
CO2: 30 mmol/L (ref 22–32)
Creatinine, Ser: 0.81 mg/dL (ref 0.44–1.00)
GFR calc non Af Amer: >60 mL/min (ref >60)
Glucose, Bld: 124 mg/dL — ABNORMAL HIGH (ref 65–99)
POTASSIUM: 3.5 mmol/L (ref 3.5–5.1)
Sodium: 140 mmol/L (ref 135–145)

## 2017-12-20 LAB — MAGNESIUM: Magnesium: 2.1 mg/dL (ref 1.7–2.4)

## 2017-12-20 LAB — PHOSPHORUS: PHOSPHORUS: 3 mg/dL (ref 2.5–4.6)

## 2017-12-20 MED ORDER — OXYCODONE-ACETAMINOPHEN 5-325 MG PO TABS
1.0000 | ORAL_TABLET | ORAL | Status: DC | PRN
  Administered 2017-12-20 – 2017-12-25 (×9): 1 via ORAL
  Filled 2017-12-20 (×10): qty 1

## 2017-12-20 MED ORDER — METRONIDAZOLE IN NACL 5-0.79 MG/ML-% IV SOLN
500.0000 mg | Freq: Three times a day (TID) | INTRAVENOUS | Status: DC
  Administered 2017-12-21 – 2017-12-22 (×4): 500 mg via INTRAVENOUS
  Filled 2017-12-20 (×6): qty 100

## 2017-12-20 MED ORDER — MORPHINE SULFATE (PF) 2 MG/ML IV SOLN
1.0000 mg | INTRAVENOUS | Status: DC | PRN
  Administered 2017-12-20 – 2017-12-22 (×6): 2 mg via INTRAVENOUS
  Filled 2017-12-20 (×6): qty 1

## 2017-12-20 NOTE — Progress Notes (Signed)
Patient has order to transfer to any with telemetry monitor, RN educated patient of risk of foley catheter upon which patient agreed to remove cath.  Foley cath removed with no difficulty.  Patient remains alert and oriented, vital signs stable, awaiting room assignment.

## 2017-12-20 NOTE — Progress Notes (Signed)
Pharmacy Electrolyte Monitoring Consult:  Pharmacy consulted to assist in monitoring and replacing electrolytes in this 84 y.o. female admitted on 12/18/2017 wit67h Bradycardia and Fall   Labs:  Sodium (mmol/L)  Date Value  12/20/2017 140   Potassium (mmol/L)  Date Value  12/20/2017 3.5   Magnesium (mg/dL)  Date Value  16/10/960404/18/2019 2.1   Phosphorus (mg/dL)  Date Value  54/09/811904/18/2019 3.0   Calcium (mg/dL)  Date Value  14/78/295604/18/2019 8.3 (L)   Albumin (g/dL)  Date Value  21/30/865704/17/2019 3.0 (L)    Plan: No replacement warranted at this time. Will recheck electrolytes with am labs.   4/18: electrolytes WNL; no supplement at this time  Pharmacy will continue to monitor and adjust per consult.   Sahara Fujimoto A 12/20/2017 2:49 PM

## 2017-12-20 NOTE — Progress Notes (Signed)
Cath Lab tech removed sheath per Dr America BrownFath's order without difficulty.

## 2017-12-20 NOTE — Progress Notes (Signed)
Patient with room assignment to room 237, patient updated.  Report called to receiving RN, Alan Ripperlaire with no further questions.

## 2017-12-20 NOTE — Progress Notes (Signed)
Patient Name: Sydney Mitchell Date of Encounter: 12/20/2017  Hospital Problem List     Active Problems:   Bradycardia    Patient Profile     Pt with transient bradycardia with syncope.   Subjective   No further syncoope  Inpatient Medications    . enoxaparin (LOVENOX) injection  40 mg Subcutaneous Q24H  . senna-docusate  1 tablet Oral QHS  . sodium chloride flush  3 mL Intravenous Q12H    Vital Signs    Vitals:   12/20/17 0500 12/20/17 0600 12/20/17 0700 12/20/17 0800  BP: (!) 117/53 (!) 108/49 (!) 108/48 (!) 109/51  Pulse: 73 79 73 74  Resp: 20 10 15 20   Temp:    98.3 F (36.8 C)  TempSrc:    Oral  SpO2: 96% 96% 92% 96%  Weight: 94.7 kg (208 lb 12.4 oz)     Height:        Intake/Output Summary (Last 24 hours) at 12/20/2017 0819 Last data filed at 12/20/2017 0600 Gross per 24 hour  Intake 703 ml  Output 700 ml  Net 3 ml   Filed Weights   12/18/17 1619 12/18/17 2100 12/20/17 0500  Weight: 114.4 kg (252 lb 3.2 oz) 92.2 kg (203 lb 4.2 oz) 94.7 kg (208 lb 12.4 oz)    Physical Exam    GEN: Well nourished, well developed, in no acute distress.  HEENT: normal.  Neck: Supple, no JVD, carotid bruits, or masses. Cardiac: RRR, no murmurs, rubs, or gallops. No clubbing, cyanosis, edema.  Radials/DP/PT 2+ and equal bilaterally.  Respiratory:  Respirations regular and unlabored, clear to auscultation bilaterally. GI: Soft, nontender, nondistended, BS + x 4. MS: no deformity or atrophy. Skin: warm and dry, no rash. Neuro:  Strength and sensation are intact. Psych: Normal affect.  Labs    CBC Recent Labs    12/18/17 1618 12/19/17 0407  WBC 22.9* 18.5*  HGB 11.6* 11.1*  HCT 37.0 34.1*  MCV 82.1 80.1  PLT 513* 415   Basic Metabolic Panel Recent Labs    40/98/1104/16/19 2139 12/19/17 0407 12/20/17 0603  NA  --  138 140  K  --  4.0 3.5  CL  --  104 105  CO2  --  28 30  GLUCOSE  --  125* 124*  BUN  --  18 20  CREATININE  --  0.90 0.81  CALCIUM  --  8.1*  8.3*  MG 1.6*  --  2.1  PHOS  --   --  3.0   Liver Function Tests Recent Labs    12/19/17 0407  AST 38  ALT 27  ALKPHOS 75  BILITOT 0.7  PROT 7.1  ALBUMIN 3.0*   No results for input(s): LIPASE, AMYLASE in the last 72 hours. Cardiac Enzymes Recent Labs    12/18/17 1618 12/18/17 2139 12/19/17 0407  TROPONINI 0.06* 0.56* 0.62*   BNP No results for input(s): BNP in the last 72 hours. D-Dimer No results for input(s): DDIMER in the last 72 hours. Hemoglobin A1C Recent Labs    12/18/17 2139  HGBA1C 5.6   Fasting Lipid Panel No results for input(s): CHOL, HDL, LDLCALC, TRIG, CHOLHDL, LDLDIRECT in the last 72 hours. Thyroid Function Tests No results for input(s): TSH, T4TOTAL, T3FREE, THYROIDAB in the last 72 hours.  Invalid input(s): FREET3  Telemetry    nsr  ECG    chb transiently with current nsr  Radiology    Ct Head Wo Contrast  Result Date: 12/18/2017 CLINICAL DATA:  Fall. Dizziness. Bradycardia and brief CPR. Initial encounter. EXAM: CT HEAD WITHOUT CONTRAST CT CERVICAL SPINE WITHOUT CONTRAST TECHNIQUE: Multidetector CT imaging of the head and cervical spine was performed following the standard protocol without intravenous contrast. Multiplanar CT image reconstructions of the cervical spine were also generated. COMPARISON:  None. FINDINGS: CT HEAD FINDINGS Brain: There is no evidence of acute infarct, intracranial hemorrhage, mass, midline shift, or extra-axial fluid collection. Subcortical and periventricular white matter hypodensities are nonspecific but compatible with mild-to-moderate chronic small vessel ischemic disease. There is mild-to-moderate cerebral atrophy. Vascular: Calcified atherosclerosis at the skull base. No hyperdense vessel. Skull: No fracture or focal osseous lesion. Sinuses/Orbits: Visualized paranasal sinuses and mastoid air cells are clear. Orbits are unremarkable. Other: Small right frontal scalp hematoma. CT CERVICAL SPINE FINDINGS  Alignment: Trace anterolisthesis of C2 on C3 and trace retrolisthesis of C4 on C5, likely degenerative. Skull base and vertebrae: No acute fracture or suspicious osseous lesion. Soft tissues and spinal canal: No prevertebral fluid or swelling. No visible canal hematoma. Disc levels: Diffuse cervical disc degeneration, most advanced at C3-4 and C4-5 where there is severe disc space narrowing, endplate sclerosis, and endplate osteophyte formation. Mild-to-moderate spinal stenosis at C4-5. Moderate to severe left facet arthrosis at C2-3 with moderate facet arthrosis at other levels. Advanced right-sided neural foraminal stenosis from C3-4-C5-6. Disc calcification at C7-T1. Upper chest: Clear lung apices. Other: Extensive calcified atherosclerosis at the carotid bifurcations. IMPRESSION: 1. No evidence of acute intracranial abnormality. 2. Small right frontal scalp hematoma. 3. Mild-to-moderate chronic small vessel ischemic disease and cerebral atrophy. 4. No cervical spine fracture. 5. Advanced cervical disc degeneration. Electronically Signed   By: Sebastian Ache M.D.   On: 12/18/2017 18:22   Ct Chest W Contrast  Result Date: 12/18/2017 CLINICAL DATA:  Bradycardic. CPR for pulseless. Recent diarrhea. Elevated white blood cell count EXAM: CT CHEST, ABDOMEN, AND PELVIS WITH CONTRAST TECHNIQUE: Multidetector CT imaging of the chest, abdomen and pelvis was performed following the standard protocol during bolus administration of intravenous contrast. CONTRAST:  <See Chart> OMNIPAQUE IOHEXOL 300 MG/ML SOLN, ISOVUE-300 IOPAMIDOL (ISOVUE-300) INJECTION 61% COMPARISON:  Chest radiograph 12/18/2017 FINDINGS: CT CHEST FINDINGS Cardiovascular: The pacing wires in the RIGHT atrium and ventricle from a LEFT groin approach no pericardial effusion. Coronary calcifications present no central pulmonary embolism. Mediastinum/Nodes: No axillary or supraclavicular adenopathy. No mediastinal hilar adenopathy. No pericardial  effusion. Esophagus normal. Lungs/Pleura: RIGHT lower lobe atelectasis. Mildly elevated RIGHT hemidiaphragm. No airspace disease. No pneumothorax Musculoskeletal: No aggressive osseous lesion CT ABDOMEN PELVIS FINDING Hepatobiliary: No focal hepatic lesion. Postcholecystectomy. No biliary dilatation. Pancreas: Pancreas is normal. No ductal dilatation. No pancreatic inflammation. Spleen: Normal spleen Adrenals/urinary tract: Adrenal glands normal. Low-attenuation cysts within the RIGHT renal cortex. Ureters normal. Bladder wall trabeculation. Stomach/Bowel: Stomach, duodenum small-bowel normal. Appendix cecum normal. Ascending transverse colon normal. There are several diverticula of the transverse colon descending colon. Within the distal descending colon proximal sigmoid colon there is a long segment of bowel wall thickening and pericolonic inflammation. There multiple diverticula through this region. No macro perforation or abscess is identified. Or distal sigmoid colon and rectum are normal. The sigmoid colon inflammation extends approximately 12 cm (for example image 93/3) Vascular/Lymphatic: Abdominal aorta is normal caliber with atherosclerotic calcification. There is no retroperitoneal or periportal lymphadenopathy. No pelvic lymphadenopathy. The celiac trunk, SMA and IMA are patent. Reproductive: Post hysterectomy Other: No free fluid. Musculoskeletal: No aggressive osseous lesion. There is chronic elevation of the pressure there is significant elevation of  the LEFT hemidiaphragm. IMPRESSION: Chest Impression: 1. Dense RIGHT basilar atelectasis. Atelectasis is presumably related to the markedly elevated RIGHT hemidiaphragm. 2.  Pacing leads in the RIGHT heart.  No pericardial effusion Abdomen / Pelvis Impression: 1. Long 12 cm segment of sigmoid colon inflammation in region of multiple diverticula is most consistent with acute diverticulitis. Segmental colitis would be a secondary consideration. Differential  for colitis would include ischemia and inflammatory bowel disease. 2. No perforation or abscess. 3. No bladder wall trabeculation Electronically Signed   By: Genevive Bi M.D.   On: 12/18/2017 21:04   Ct Cervical Spine Wo Contrast  Result Date: 12/18/2017 CLINICAL DATA:  Fall. Dizziness. Bradycardia and brief CPR. Initial encounter. EXAM: CT HEAD WITHOUT CONTRAST CT CERVICAL SPINE WITHOUT CONTRAST TECHNIQUE: Multidetector CT imaging of the head and cervical spine was performed following the standard protocol without intravenous contrast. Multiplanar CT image reconstructions of the cervical spine were also generated. COMPARISON:  None. FINDINGS: CT HEAD FINDINGS Brain: There is no evidence of acute infarct, intracranial hemorrhage, mass, midline shift, or extra-axial fluid collection. Subcortical and periventricular white matter hypodensities are nonspecific but compatible with mild-to-moderate chronic small vessel ischemic disease. There is mild-to-moderate cerebral atrophy. Vascular: Calcified atherosclerosis at the skull base. No hyperdense vessel. Skull: No fracture or focal osseous lesion. Sinuses/Orbits: Visualized paranasal sinuses and mastoid air cells are clear. Orbits are unremarkable. Other: Small right frontal scalp hematoma. CT CERVICAL SPINE FINDINGS Alignment: Trace anterolisthesis of C2 on C3 and trace retrolisthesis of C4 on C5, likely degenerative. Skull base and vertebrae: No acute fracture or suspicious osseous lesion. Soft tissues and spinal canal: No prevertebral fluid or swelling. No visible canal hematoma. Disc levels: Diffuse cervical disc degeneration, most advanced at C3-4 and C4-5 where there is severe disc space narrowing, endplate sclerosis, and endplate osteophyte formation. Mild-to-moderate spinal stenosis at C4-5. Moderate to severe left facet arthrosis at C2-3 with moderate facet arthrosis at other levels. Advanced right-sided neural foraminal stenosis from C3-4-C5-6. Disc  calcification at C7-T1. Upper chest: Clear lung apices. Other: Extensive calcified atherosclerosis at the carotid bifurcations. IMPRESSION: 1. No evidence of acute intracranial abnormality. 2. Small right frontal scalp hematoma. 3. Mild-to-moderate chronic small vessel ischemic disease and cerebral atrophy. 4. No cervical spine fracture. 5. Advanced cervical disc degeneration. Electronically Signed   By: Sebastian Ache M.D.   On: 12/18/2017 18:22   Ct Abdomen Pelvis W Contrast  Result Date: 12/18/2017 CLINICAL DATA:  Bradycardic. CPR for pulseless. Recent diarrhea. Elevated white blood cell count EXAM: CT CHEST, ABDOMEN, AND PELVIS WITH CONTRAST TECHNIQUE: Multidetector CT imaging of the chest, abdomen and pelvis was performed following the standard protocol during bolus administration of intravenous contrast. CONTRAST:  <See Chart> OMNIPAQUE IOHEXOL 300 MG/ML SOLN, ISOVUE-300 IOPAMIDOL (ISOVUE-300) INJECTION 61% COMPARISON:  Chest radiograph 12/18/2017 FINDINGS: CT CHEST FINDINGS Cardiovascular: The pacing wires in the RIGHT atrium and ventricle from a LEFT groin approach no pericardial effusion. Coronary calcifications present no central pulmonary embolism. Mediastinum/Nodes: No axillary or supraclavicular adenopathy. No mediastinal hilar adenopathy. No pericardial effusion. Esophagus normal. Lungs/Pleura: RIGHT lower lobe atelectasis. Mildly elevated RIGHT hemidiaphragm. No airspace disease. No pneumothorax Musculoskeletal: No aggressive osseous lesion CT ABDOMEN PELVIS FINDING Hepatobiliary: No focal hepatic lesion. Postcholecystectomy. No biliary dilatation. Pancreas: Pancreas is normal. No ductal dilatation. No pancreatic inflammation. Spleen: Normal spleen Adrenals/urinary tract: Adrenal glands normal. Low-attenuation cysts within the RIGHT renal cortex. Ureters normal. Bladder wall trabeculation. Stomach/Bowel: Stomach, duodenum small-bowel normal. Appendix cecum normal. Ascending transverse colon  normal.  There are several diverticula of the transverse colon descending colon. Within the distal descending colon proximal sigmoid colon there is a long segment of bowel wall thickening and pericolonic inflammation. There multiple diverticula through this region. No macro perforation or abscess is identified. Or distal sigmoid colon and rectum are normal. The sigmoid colon inflammation extends approximately 12 cm (for example image 93/3) Vascular/Lymphatic: Abdominal aorta is normal caliber with atherosclerotic calcification. There is no retroperitoneal or periportal lymphadenopathy. No pelvic lymphadenopathy. The celiac trunk, SMA and IMA are patent. Reproductive: Post hysterectomy Other: No free fluid. Musculoskeletal: No aggressive osseous lesion. There is chronic elevation of the pressure there is significant elevation of the LEFT hemidiaphragm. IMPRESSION: Chest Impression: 1. Dense RIGHT basilar atelectasis. Atelectasis is presumably related to the markedly elevated RIGHT hemidiaphragm. 2.  Pacing leads in the RIGHT heart.  No pericardial effusion Abdomen / Pelvis Impression: 1. Long 12 cm segment of sigmoid colon inflammation in region of multiple diverticula is most consistent with acute diverticulitis. Segmental colitis would be a secondary consideration. Differential for colitis would include ischemia and inflammatory bowel disease. 2. No perforation or abscess. 3. No bladder wall trabeculation Electronically Signed   By: Genevive Bi M.D.   On: 12/18/2017 21:04   Dg Chest Portable 1 View  Result Date: 12/18/2017 CLINICAL DATA:  Pain following fall EXAM: PORTABLE CHEST 1 VIEW COMPARISON:  None. FINDINGS: There is marked elevation of the right hemidiaphragm. There is atelectasis in the right base. The lungs elsewhere clear. Heart size and pulmonary vascularity are normal. No adenopathy. There is aortic atherosclerosis. Bones are osteoporotic. There is a total shoulder replacement on the left. No  pneumothorax. IMPRESSION: Marked elevation of the right hemidiaphragm with right base atelectasis. Lungs elsewhere clear. Heart size normal. No pneumothorax. There is aortic atherosclerosis. Aortic Atherosclerosis (ICD10-I70.0). Electronically Signed   By: Bretta Bang III M.D.   On: 12/18/2017 18:52   Dg Hip Unilat W Or Wo Pelvis 2-3 Views Left  Result Date: 12/18/2017 CLINICAL DATA:  Pain following fall EXAM: DG HIP (WITH OR WITHOUT PELVIS) 2-3V LEFT COMPARISON:  None. FINDINGS: Frontal pelvis as well as frontal and lateral left hip images were obtained. Bones are diffusely osteoporotic. No fracture or dislocation is appreciable. There is marked narrowing of the left hip joint with bony eburnation on both sides of the left hip joint. There is mild narrowing of the right hip joint. No erosive changes. IMPRESSION: Diffuse osteoporotic diffuse osteoporosis. Marked osteoarthritis in the left hip joint. Milder osteoarthritic change in the right hip joint. No fracture evident. Note that a subtle impaction injury could easily be obscured given this degree of osteoporosis. If there remains concern for potential proximal femur fracture on the left, correlation with cross-sectional imaging may be of value in this circumstance. Electronically Signed   By: Bretta Bang III M.D.   On: 12/18/2017 18:51    Assessment & Plan    chb-no in nsr. Temp pacer removed. Venous sheath removed. Etiology unclear. Likely electrolyte abnormality.  Will follow heart rate and avoid av nodal meds.   Signed, Darlin Priestly Fath MD 12/20/2017, 8:19 AM  Pager: [phone]

## 2017-12-21 LAB — BASIC METABOLIC PANEL
ANION GAP: 3 — AB (ref 5–15)
BUN: 22 mg/dL — ABNORMAL HIGH (ref 6–20)
CALCIUM: 8.4 mg/dL — AB (ref 8.9–10.3)
CO2: 32 mmol/L (ref 22–32)
Chloride: 105 mmol/L (ref 101–111)
Creatinine, Ser: 0.77 mg/dL (ref 0.44–1.00)
GFR calc Af Amer: >60 mL/min (ref >60)
GFR calc non Af Amer: >60 mL/min (ref >60)
Glucose, Bld: 118 mg/dL — ABNORMAL HIGH (ref 65–99)
POTASSIUM: 3.5 mmol/L (ref 3.5–5.1)
Sodium: 140 mmol/L (ref 135–145)

## 2017-12-21 LAB — CULTURE, BLOOD (ROUTINE X 2): Special Requests: ADEQUATE

## 2017-12-21 MED ORDER — CIPROFLOXACIN HCL 500 MG PO TABS
500.0000 mg | ORAL_TABLET | Freq: Two times a day (BID) | ORAL | Status: DC
  Administered 2017-12-21 – 2017-12-25 (×8): 500 mg via ORAL
  Filled 2017-12-21 (×10): qty 1

## 2017-12-21 NOTE — Progress Notes (Signed)
Pt bladder scanned at 20:00 and 04:00. Bladder volume 189 at 20:00 and 359 at 04:00. MD Sheryle Hailiamond made aware. No new orders at this time. Will continue to monitor.

## 2017-12-21 NOTE — Progress Notes (Signed)
[facility] [place] [facility]   PATIENT NAME: Sydney MonksCarol Mitchell    MR#:  [id number]  DATE OF BIRTH:  [birth year]  SUBJECTIVE:  CHIEF COMPLAINT:   Chief Complaint  Patient presents with  . Bradycardia  . Fall   Patient being transferred out of ICU.  Temporary pacemaker removed.  REVIEW OF SYSTEMS:    Review of Systems  Constitutional: Positive for malaise/fatigue. Negative for chills and fever.  HENT: Negative for sore throat.   Eyes: Negative for blurred vision, double vision and pain.  Respiratory: Positive for cough. Negative for hemoptysis, shortness of breath and wheezing.   Cardiovascular: Negative for chest pain, palpitations, orthopnea and leg swelling.  Gastrointestinal: Positive for abdominal pain and diarrhea. Negative for constipation, heartburn, nausea and vomiting.  Genitourinary: Negative for dysuria and hematuria.  Musculoskeletal: Negative for back pain and joint pain.  Skin: Negative for rash.  Neurological: Negative for sensory change, speech change, focal weakness and headaches.  Endo/Heme/Allergies: Does not bruise/bleed easily.  Psychiatric/Behavioral: Negative for depression. The patient is not nervous/anxious.     DRUG ALLERGIES:  No Known Allergies  VITALS:  Blood pressure (!) 125/51, pulse 68, temperature 98.5 F (36.9 C), temperature source Oral, resp. rate 19, height 5\' 1"  (1.549 m), weight 92.9 kg (204 lb 11.2 oz), SpO2 93 %.  PHYSICAL EXAMINATION:   Physical Exam  GENERAL:  84 y.o.-year-old patient lying in the bed with no acute distress.  EYES: Pupils equal, round, reactive to light and accommodation. No scleral icterus. Extraocular muscles intact.  HEENT: Head atraumatic, normocephalic. Oropharynx and nasopharynx clear.  NECK:  Supple, no jugular venous distention. No thyroid enlargement, no tenderness.  LUNGS: Normal breath sounds bilaterally, no wheezing, rales, rhonchi. No use of accessory muscles of respiration.   CARDIOVASCULAR: S1, S2 normal. No murmurs, rubs, or gallops.  ABDOMEN: Soft, nontender, nondistended. Bowel sounds present. No organomegaly or mass.  EXTREMITIES: No cyanosis, clubbing or edema b/l.    NEUROLOGIC: Cranial nerves II through XII are intact. No focal Motor or sensory deficits b/l.   PSYCHIATRIC: The patient is alert and oriented x 3.  SKIN: No obvious rash, lesion, or ulcer.   LABORATORY PANEL:   CBC Recent Labs  Lab 12/19/17 0407  WBC 18.5*  HGB 11.1*  HCT 34.1*  PLT 415   ------------------------------------------------------------------------------------------------------------------ Chemistries  Recent Labs  Lab 12/19/17 0407 12/20/17 0603 12/21/17 0538  NA 138 140 140  K 4.0 3.5 3.5  CL 104 105 105  CO2 28 30 32  GLUCOSE 125* 124* 118*  BUN 18 20 22*  CREATININE 0.90 0.81 0.77  CALCIUM 8.1* 8.3* 8.4*  MG  --  2.1  --   AST 38  --   --   ALT 27  --   --   ALKPHOS 75  --   --   BILITOT 0.7  --   --    ------------------------------------------------------------------------------------------------------------------  Cardiac Enzymes Recent Labs  Lab 12/19/17 0407  TROPONINI 0.62*   ------------------------------------------------------------------------------------------------------------------  RADIOLOGY:  No results found.   ASSESSMENT AND PLAN:   *Bradycardia.  Etiology unclear.  Could be due to electrolyte imbalances with hypokalemia and hypomagnesemia  Temporary pacer removed.  Patient in normal sinus rhythm.  *Sepsis secondary to colitis.  On IV antibiotics. C. difficile and stool PCR negative  *Osteoarthritis.  Pain medications as needed.  *DVT prophylaxis with Lovenox   All the records are reviewed and case discussed with Care Management/Social Worker Management plans discussed with the patient,  family and they are in agreement.  CODE STATUS: FULL CODE  DVT Prophylaxis: SCDs  TOTAL TIME TAKING CARE OF THIS PATIENT:  30 minutes.   POSSIBLE D/C IN 1-2 DAYS, DEPENDING ON CLINICAL CONDITION.  Orie Fisherman M.D on 12/21/2017 at 11:02 AM  Between 7am to 6pm - Pager - [phone]  After 6pm go to [email or web address] - password [email or web address]  [facility] [place] [facility]  Office  [phone]  CC: Primary care physician; Patient, No Pcp Per  Note: This dictation was prepared with Dragon dictation along with smaller phrase technology. Any transcriptional errors that result from this process are unintentional.

## 2017-12-21 NOTE — Progress Notes (Signed)
Pharmacy Electrolyte Monitoring Consult:  Pharmacy consulted to assist in monitoring and replacing electrolytes in this 84 y.o. female admitted on 12/18/2017 with Bradycardia and Fall   Labs:  Sodium (mmol/L)  Date Value  12/21/2017 140   Potassium (mmol/L)  Date Value  12/21/2017 3.5   Magnesium (mg/dL)  Date Value  16/10/960404/18/2019 2.1   Phosphorus (mg/dL)  Date Value  54/09/811904/18/2019 3.0   Calcium (mg/dL)  Date Value  14/78/295604/19/2019 8.4 (L)   Albumin (g/dL)  Date Value  21/30/865704/17/2019 3.0 (L)    Plan: No replacement warranted at this time. Will recheck electrolytes with am labs.   4/19: electrolytes WNL; no supplement at this time  Pharmacy will continue to monitor and adjust per consult.   Gerre PebblesGarrett Jumanah Hynson 12/21/2017 1:37 PM
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
## 2017-12-21 NOTE — Care Management Important Message (Signed)
Important Message  Patient Details  Name: Sydney Mitchell MRN: [id number] Date of Birth: [birth year]   Medicare Important Message Given:  Yes    Olegario MessierKathy A Kenetra Hildenbrand 12/21/2017, 11:48 AM

## 2017-12-22 LAB — BASIC METABOLIC PANEL
ANION GAP: 3 — AB (ref 5–15)
BUN: 19 mg/dL (ref 6–20)
CO2: 35 mmol/L — AB (ref 22–32)
Calcium: 8.5 mg/dL — ABNORMAL LOW (ref 8.9–10.3)
Chloride: 104 mmol/L (ref 101–111)
Creatinine, Ser: 0.63 mg/dL (ref 0.44–1.00)
GFR calc non Af Amer: >60 mL/min (ref >60)
GLUCOSE: 111 mg/dL — AB (ref 65–99)
POTASSIUM: 3.6 mmol/L (ref 3.5–5.1)
Sodium: 142 mmol/L (ref 135–145)

## 2017-12-22 LAB — MAGNESIUM: Magnesium: 1.7 mg/dL (ref 1.7–2.4)

## 2017-12-22 MED ORDER — METRONIDAZOLE 500 MG PO TABS
500.0000 mg | ORAL_TABLET | Freq: Three times a day (TID) | ORAL | Status: DC
  Administered 2017-12-22 – 2017-12-25 (×10): 500 mg via ORAL
  Filled 2017-12-22 (×12): qty 1

## 2017-12-22 MED ORDER — MAGNESIUM SULFATE 2 GM/50ML IV SOLN
2.0000 g | Freq: Once | INTRAVENOUS | Status: AC
  Administered 2017-12-22: 2 g via INTRAVENOUS
  Filled 2017-12-22: qty 50

## 2017-12-22 MED ORDER — TRAZODONE HCL 50 MG PO TABS
50.0000 mg | ORAL_TABLET | Freq: Every day | ORAL | Status: DC
  Administered 2017-12-22 – 2017-12-24 (×3): 50 mg via ORAL
  Filled 2017-12-22 (×3): qty 1

## 2017-12-22 NOTE — Plan of Care (Signed)
  Problem: Coping: Goal: Level of anxiety will decrease Outcome: Progressing   Problem: Pain Managment: Goal: General experience of comfort will improve Outcome: Progressing   

## 2017-12-22 NOTE — Progress Notes (Signed)
Pharmacy Electrolyte Monitoring Consult:  Pharmacy consulted to assist in monitoring and replacing electrolytes in this 84 y.o. female admitted on 12/18/2017 with Bradycardia and Fall   Labs:  Sodium (mmol/L)  Date Value  12/22/2017 142   Potassium (mmol/L)  Date Value  12/22/2017 3.6   Magnesium (mg/dL)  Date Value  16/10/960404/20/2019 1.7   Phosphorus (mg/dL)  Date Value  54/09/811904/18/2019 3.0   Calcium (mg/dL)  Date Value  14/78/295604/20/2019 8.5 (L)   Albumin (g/dL)  Date Value  21/30/865704/17/2019 3.0 (L)    Plan: K 3.6, Mag 1.7.  Will order mag sulfate 2 g IV x1 per protocol. Will recheck electrolytes with am labs.     Pharmacy will continue to monitor and adjust per consult.   Marty HeckWang, Sadia Belfiore L 12/22/2017 10:37 AM

## 2017-12-22 NOTE — Progress Notes (Signed)
[facility] - [place] [facility]   PATIENT NAME: Sydney Mitchell    MR#:  [id number]  DATE OF BIRTH:  1934-09-01  SUBJECTIVE:  CHIEF COMPLAINT:   Chief Complaint  Patient presents with  . Bradycardia  . Fall   Feels weak still.  Pain all over.  REVIEW OF SYSTEMS:    Review of Systems  Constitutional: Positive for malaise/fatigue. Negative for chills and fever.  HENT: Negative for sore throat.   Eyes: Negative for blurred vision, double vision and pain.  Respiratory: Positive for cough. Negative for hemoptysis, shortness of breath and wheezing.   Cardiovascular: Negative for chest pain, palpitations, orthopnea and leg swelling.  Gastrointestinal: Positive for abdominal pain and diarrhea. Negative for constipation, heartburn, nausea and vomiting.  Genitourinary: Negative for dysuria and hematuria.  Musculoskeletal: Negative for back pain and joint pain.  Skin: Negative for rash.  Neurological: Negative for sensory change, speech change, focal weakness and headaches.  Endo/Heme/Allergies: Does not bruise/bleed easily.  Psychiatric/Behavioral: Negative for depression. The patient is not nervous/anxious.    DRUG ALLERGIES:  No Known Allergies  VITALS:  Blood pressure (!) 141/67, pulse 83, temperature 97.9 F (36.6 C), resp. rate 18, height 5\' 1"  (1.549 m), weight 92.9 kg (204 lb 12.9 oz), SpO2 97 %.  PHYSICAL EXAMINATION:   Physical Exam  GENERAL:  84 y.o.-year-old patient lying in the bed with no acute distress.  EYES: Pupils equal, round, reactive to light and accommodation. No scleral icterus. Extraocular muscles intact.  HEENT: Head atraumatic, normocephalic. Oropharynx and nasopharynx clear.  NECK:  Supple, no jugular venous distention. No thyroid enlargement, no tenderness.  LUNGS: Normal breath sounds bilaterally, no wheezing, rales, rhonchi. No use of accessory muscles of respiration.  CARDIOVASCULAR: S1, S2 normal. No murmurs, rubs, or gallops.   ABDOMEN: Soft, nontender, nondistended. Bowel sounds present. No organomegaly or mass.  EXTREMITIES: No cyanosis, clubbing or edema b/l.    NEUROLOGIC: Cranial nerves II through XII are intact. No focal Motor or sensory deficits b/l.   PSYCHIATRIC: The patient is alert and oriented x 3.  SKIN: No obvious rash, lesion, or ulcer.   LABORATORY PANEL:   CBC Recent Labs  Lab 12/19/17 0407  WBC 18.5*  HGB 11.1*  HCT 34.1*  PLT 415   ------------------------------------------------------------------------------------------------------------------ Chemistries  Recent Labs  Lab 12/19/17 0407  12/22/17 0401  NA 138   < > 142  K 4.0   < > 3.6  CL 104   < > 104  CO2 28   < > 35*  GLUCOSE 125*   < > 111*  BUN 18   < > 19  CREATININE 0.90   < > 0.63  CALCIUM 8.1*   < > 8.5*  MG  --    < > 1.7  AST 38  --   --   ALT 27  --   --   ALKPHOS 75  --   --   BILITOT 0.7  --   --    < > = values in this interval not displayed.   ------------------------------------------------------------------------------------------------------------------  Cardiac Enzymes Recent Labs  Lab 12/19/17 0407  TROPONINI 0.62*   ------------------------------------------------------------------------------------------------------------------  RADIOLOGY:  No results found.   ASSESSMENT AND PLAN:   *Bradycardia.  Etiology unclear.  Could be due to electrolyte imbalances with hypokalemia and hypomagnesemia  Temporary pacer removed.  Patient in normal sinus rhythm.  *Sepsis secondary to colitis. On IV antibiotics.  Change to oral antibiotics today C. difficile and stool PCR  negative  *Osteoarthritis.  Pain medications as needed.  *DVT prophylaxis with Lovenox   All the records are reviewed and case discussed with Care Management/Social Worker Management plans discussed with the patient, family and they are in agreement.  CODE STATUS: FULL CODE  DVT Prophylaxis: SCDs  TOTAL TIME TAKING  CARE OF THIS PATIENT: 30 minutes.   POSSIBLE D/C IN 1-2 DAYS, DEPENDING ON CLINICAL CONDITION.  Molinda Bailiff Sarie Stall M.D on 12/22/2017 at 11:47 AM  Between 7am to 6pm - Pager - [phone]  After 6pm go to [email or web address] - password [email or web address]  [facility] [place] [facility]  Office  [phone]  CC: Primary care physician; Patient, No Pcp Per  Note: This dictation was prepared with Dragon dictation along with smaller phrase technology. Any transcriptional errors that result from this process are unintentional.

## 2017-12-22 NOTE — Progress Notes (Signed)
[facility] - [place] [facility]   PATIENT NAME: Sydney MonksCarol Wisner    MR#:  [id number]  DATE OF BIRTH:  09/12/1933  SUBJECTIVE:  CHIEF COMPLAINT:   Chief Complaint  Patient presents with  . Bradycardia  . Fall   Feels weak.  Abd pain improving.  REVIEW OF SYSTEMS:    Review of Systems  Constitutional: Positive for malaise/fatigue. Negative for chills and fever.  HENT: Negative for sore throat.   Eyes: Negative for blurred vision, double vision and pain.  Respiratory: Positive for cough. Negative for hemoptysis, shortness of breath and wheezing.   Cardiovascular: Negative for chest pain, palpitations, orthopnea and leg swelling.  Gastrointestinal: Positive for abdominal pain and diarrhea. Negative for constipation, heartburn, nausea and vomiting.  Genitourinary: Negative for dysuria and hematuria.  Musculoskeletal: Negative for back pain and joint pain.  Skin: Negative for rash.  Neurological: Negative for sensory change, speech change, focal weakness and headaches.  Endo/Heme/Allergies: Does not bruise/bleed easily.  Psychiatric/Behavioral: Negative for depression. The patient is not nervous/anxious.     DRUG ALLERGIES:  No Known Allergies  VITALS:  Blood pressure (!) 151/79, pulse 92, temperature 97.9 F (36.6 C), resp. rate 18, height 5\' 1"  (1.549 m), weight 92.9 kg (204 lb 12.9 oz), SpO2 99 %.  PHYSICAL EXAMINATION:   Physical Exam  GENERAL:  84 y.o.-year-old patient lying in the bed with no acute distress.  EYES: Pupils equal, round, reactive to light and accommodation. No scleral icterus. Extraocular muscles intact.  HEENT: Head atraumatic, normocephalic. Oropharynx and nasopharynx clear.  NECK:  Supple, no jugular venous distention. No thyroid enlargement, no tenderness.  LUNGS: Normal breath sounds bilaterally, no wheezing, rales, rhonchi. No use of accessory muscles of respiration.  CARDIOVASCULAR: S1, S2 normal. No murmurs, rubs, or gallops.   ABDOMEN: Soft, nontender, nondistended. Bowel sounds present. No organomegaly or mass.  EXTREMITIES: No cyanosis, clubbing or edema b/l.    NEUROLOGIC: Cranial nerves II through XII are intact. No focal Motor or sensory deficits b/l.   PSYCHIATRIC: The patient is alert and oriented x 3.  SKIN: No obvious rash, lesion, or ulcer.   LABORATORY PANEL:   CBC Recent Labs  Lab 12/19/17 0407  WBC 18.5*  HGB 11.1*  HCT 34.1*  PLT 415   ------------------------------------------------------------------------------------------------------------------ Chemistries  Recent Labs  Lab 12/19/17 0407  12/22/17 0401  NA 138   < > 142  K 4.0   < > 3.6  CL 104   < > 104  CO2 28   < > 35*  GLUCOSE 125*   < > 111*  BUN 18   < > 19  CREATININE 0.90   < > 0.63  CALCIUM 8.1*   < > 8.5*  MG  --    < > 1.7  AST 38  --   --   ALT 27  --   --   ALKPHOS 75  --   --   BILITOT 0.7  --   --    < > = values in this interval not displayed.   ------------------------------------------------------------------------------------------------------------------  Cardiac Enzymes Recent Labs  Lab 12/19/17 0407  TROPONINI 0.62*   ------------------------------------------------------------------------------------------------------------------  RADIOLOGY:  No results found.   ASSESSMENT AND PLAN:   *Bradycardia.  Etiology unclear.  Could be due to electrolyte imbalances with hypokalemia and hypomagnesemia  Temporary pacer removed.  Patient in normal sinus rhythm.  *Sepsis secondary to colitis. On IV antibiotics. C. difficile and stool PCR negative  *Osteoarthritis.  Pain medications  as needed.  *DVT prophylaxis with Lovenox   All the records are reviewed and case discussed with Care Management/Social Worker Management plans discussed with the patient, family and they are in agreement.  CODE STATUS: FULL CODE  DVT Prophylaxis: SCDs  TOTAL TIME TAKING CARE OF THIS PATIENT: 30 minutes.    POSSIBLE D/C IN 1-2 DAYS, DEPENDING ON CLINICAL CONDITION.  Molinda Bailiff Jahnae Mcadoo M.D on 12/22/2017 at 8:27 AM  Between 7am to 6pm - Pager - [phone]  After 6pm go to [email or web address] - password [email or web address]  [facility] [place] [facility]  Office  [phone]  CC: Primary care physician; Patient, No Pcp Per  Note: This dictation was prepared with Dragon dictation along with smaller phrase technology. Any transcriptional errors that result from this process are unintentional.

## 2017-12-23 LAB — BASIC METABOLIC PANEL
ANION GAP: 6 (ref 5–15)
BUN: 20 mg/dL (ref 6–20)
CHLORIDE: 101 mmol/L (ref 101–111)
CO2: 35 mmol/L — ABNORMAL HIGH (ref 22–32)
Calcium: 8.5 mg/dL — ABNORMAL LOW (ref 8.9–10.3)
Creatinine, Ser: 0.64 mg/dL (ref 0.44–1.00)
GFR calc non Af Amer: >60 mL/min (ref >60)
Glucose, Bld: 127 mg/dL — ABNORMAL HIGH (ref 65–99)
POTASSIUM: 3.4 mmol/L — AB (ref 3.5–5.1)
SODIUM: 142 mmol/L (ref 135–145)

## 2017-12-23 LAB — CULTURE, BLOOD (ROUTINE X 2)
Culture: NO GROWTH
Special Requests: ADEQUATE

## 2017-12-23 LAB — MAGNESIUM: Magnesium: 2 mg/dL (ref 1.7–2.4)

## 2017-12-23 MED ORDER — SIMETHICONE 80 MG PO CHEW
80.0000 mg | CHEWABLE_TABLET | Freq: Four times a day (QID) | ORAL | Status: DC | PRN
Start: 2017-12-23 — End: 2017-12-25
  Administered 2017-12-23: 80 mg via ORAL
  Filled 2017-12-23 (×2): qty 1

## 2017-12-23 MED ORDER — POTASSIUM CHLORIDE CRYS ER 20 MEQ PO TBCR
40.0000 meq | EXTENDED_RELEASE_TABLET | Freq: Once | ORAL | Status: AC
  Administered 2017-12-23: 40 meq via ORAL
  Filled 2017-12-23: qty 2

## 2017-12-23 MED ORDER — POLYETHYLENE GLYCOL 3350 17 G PO PACK
17.0000 g | PACK | Freq: Every day | ORAL | Status: DC
  Administered 2017-12-23 – 2017-12-25 (×2): 17 g via ORAL
  Filled 2017-12-23 (×3): qty 1

## 2017-12-23 MED ORDER — ADULT MULTIVITAMIN W/MINERALS CH
1.0000 | ORAL_TABLET | Freq: Every day | ORAL | Status: DC
  Administered 2017-12-24 – 2017-12-25 (×2): 1 via ORAL
  Filled 2017-12-23 (×2): qty 1

## 2017-12-23 MED ORDER — ENSURE ENLIVE PO LIQD
237.0000 mL | Freq: Two times a day (BID) | ORAL | Status: DC
  Administered 2017-12-23 – 2017-12-25 (×4): 237 mL via ORAL

## 2017-12-23 MED ORDER — ORAL CARE MOUTH RINSE
15.0000 mL | Freq: Two times a day (BID) | OROMUCOSAL | Status: DC
  Administered 2017-12-23 (×2): 15 mL via OROMUCOSAL

## 2017-12-23 NOTE — Progress Notes (Signed)
[facility] - [place] [facility]   PATIENT NAME: Sydney Mitchell    MR#:  [id number]  DATE OF BIRTH:  02-06-1934  SUBJECTIVE:  CHIEF COMPLAINT:   Chief Complaint  Patient presents with  . Bradycardia  . Fall   Continues to have pain all over.  Left greater than right.  Feels weak.  Poor appetite. On 1 L oxygen.  REVIEW OF SYSTEMS:    Review of Systems  Constitutional: Positive for malaise/fatigue. Negative for chills and fever.  HENT: Negative for sore throat.   Eyes: Negative for blurred vision, double vision and pain.  Respiratory: Positive for cough. Negative for hemoptysis, shortness of breath and wheezing.   Cardiovascular: Negative for chest pain, palpitations, orthopnea and leg swelling.  Gastrointestinal: Positive for abdominal pain and diarrhea. Negative for constipation, heartburn, nausea and vomiting.  Genitourinary: Negative for dysuria and hematuria.  Musculoskeletal: Negative for back pain and joint pain.  Skin: Negative for rash.  Neurological: Negative for sensory change, speech change, focal weakness and headaches.  Endo/Heme/Allergies: Does not bruise/bleed easily.  Psychiatric/Behavioral: Negative for depression. The patient is not nervous/anxious.    DRUG ALLERGIES:  No Known Allergies  VITALS:  Blood pressure (!) 159/78, pulse 83, temperature 98 F (36.7 C), temperature source Oral, resp. rate 18, height 5\' 1"  (1.549 m), weight 91.9 kg (202 lb 9.6 oz), SpO2 90 %.  PHYSICAL EXAMINATION:   Physical Exam  GENERAL:  84 y.o.-year-old patient lying in the bed with no acute distress.  EYES: Pupils equal, round, reactive to light and accommodation. No scleral icterus. Extraocular muscles intact.  HEENT: Head atraumatic, normocephalic. Oropharynx and nasopharynx clear.  NECK:  Supple, no jugular venous distention. No thyroid enlargement, no tenderness.  LUNGS: Normal breath sounds bilaterally, no wheezing, rales, rhonchi. No use of  accessory muscles of respiration.  CARDIOVASCULAR: S1, S2 normal. No murmurs, rubs, or gallops.  ABDOMEN: Soft, nontender, nondistended. Bowel sounds present. No organomegaly or mass.  EXTREMITIES: No cyanosis, clubbing or edema b/l.    NEUROLOGIC: Cranial nerves II through XII are intact. No focal Motor or sensory deficits b/l.   PSYCHIATRIC: The patient is alert and oriented x 3.  SKIN: No obvious rash, lesion, or ulcer.   LABORATORY PANEL:   CBC Recent Labs  Lab 12/19/17 0407  WBC 18.5*  HGB 11.1*  HCT 34.1*  PLT 415   ------------------------------------------------------------------------------------------------------------------ Chemistries  Recent Labs  Lab 12/19/17 0407  12/23/17 0411  NA 138   < > 142  K 4.0   < > 3.4*  CL 104   < > 101  CO2 28   < > 35*  GLUCOSE 125*   < > 127*  BUN 18   < > 20  CREATININE 0.90   < > 0.64  CALCIUM 8.1*   < > 8.5*  MG  --    < > 2.0  AST 38  --   --   ALT 27  --   --   ALKPHOS 75  --   --   BILITOT 0.7  --   --    < > = values in this interval not displayed.   ------------------------------------------------------------------------------------------------------------------  Cardiac Enzymes Recent Labs  Lab 12/19/17 0407  TROPONINI 0.62*   ------------------------------------------------------------------------------------------------------------------  RADIOLOGY:  No results found.   ASSESSMENT AND PLAN:   *Bradycardia.  Etiology unclear.  Could be due to electrolyte imbalances with hypokalemia and hypomagnesemia  Temporary pacer removed.  Patient in normal sinus rhythm.  *Sepsis  secondary to colitis. On IV antibiotics.  Changed to oral antibiotics  C. difficile and stool PCR negative  *Osteoarthritis.  Pain medications as needed.  *DVT prophylaxis with Lovenox  *Generalized weakness.  Physical therapy evaluation.  Skilled nursing facility at discharge.  *Acute hypoxic respiratory failure likely due to  atelectasis.  Suggested that we get a repeat chest x-ray but patient is refusing.   All the records are reviewed and case discussed with Care Management/Social Worker Management plans discussed with the patient, family and they are in agreement.  CODE STATUS: FULL CODE  DVT Prophylaxis: SCDs  TOTAL TIME TAKING CARE OF THIS PATIENT: 30 minutes.   POSSIBLE D/C IN 1-2 DAYS, DEPENDING ON CLINICAL CONDITION.  Orie Fisherman M.D on 12/23/2017 at 11:27 AM  Between 7am to 6pm - Pager - [phone]  After 6pm go to [email or web address] - password [email or web address]  [facility] [place] [facility]  Office  [phone]  CC: Primary care physician; Patient, No Pcp Per  Note: This dictation was prepared with Dragon dictation along with smaller phrase technology. Any transcriptional errors that result from this process are unintentional.

## 2017-12-23 NOTE — Progress Notes (Signed)
Pharmacy Electrolyte Monitoring Consult:  Pharmacy consulted to assist in monitoring and replacing electrolytes in this 84 y.o. female admitted on 12/18/2017 with Bradycardia and Fall   Labs:  Sodium (mmol/L)  Date Value  12/23/2017 142   Potassium (mmol/L)  Date Value  12/23/2017 3.4 (L)   Magnesium (mg/dL)  Date Value  16/10/960404/21/2019 2.0   Phosphorus (mg/dL)  Date Value  54/09/811904/18/2019 3.0   Calcium (mg/dL)  Date Value  14/78/295604/21/2019 8.5 (L)   Albumin (g/dL)  Date Value  21/30/865704/17/2019 3.0 (L)    Plan: K 3.4, mag WNL - Will order KCl 40 mEq PO x1 BMET and Mag in AM    Pharmacy will continue to monitor and adjust per consult.   Marty HeckWang, [name] L 12/23/2017 9:07 AM

## 2017-12-23 NOTE — Plan of Care (Signed)
  Problem: Education: Goal: Knowledge of General Education information will improve Outcome: Progressing   Problem: Clinical Measurements: Goal: Will remain free from infection Outcome: Progressing Goal: Diagnostic test results will improve Outcome: Progressing   

## 2017-12-23 NOTE — Progress Notes (Signed)
Initial Nutrition Assessment  DOCUMENTATION CODES:   Obesity unspecified  INTERVENTION:  Will downgrade diet to dysphagia 3 (mechanical soft) with thin liquids.  Continue Ensure Enlive po BID, each supplement provides 350 kcal and 20 grams of protein.  Provide Magic cup TID with meals, each supplement provides 290 kcal and 9 grams of protein.  Provide daily MVI.  NUTRITION DIAGNOSIS:   Inadequate oral intake related to poor appetite, diarrhea as evidenced by per patient/family report.  GOAL:   Patient will meet greater than or equal to 90% of their needs  MONITOR:   PO intake, Supplement acceptance, Labs, Weight trends, I & O's  REASON FOR ASSESSMENT:   Consult Assessment of nutrition requirement/status, Poor PO  ASSESSMENT:   84 year old female with PMHx of OA admitted with bradycardia, sepsis secondary to colitis.   -Patient s/p echocardiogram on 4/17. Systolic function normal, EF 60-65%.  Met with patient at bedside. She reports she is trying to eat some food every day but is having a poor appetite. She reports her poor appetite started PTA, but she is unsure exactly when. She is unable to provide details on intake PTA. She reports she came in with abdominal pain and diarrhea. Reports her diarrhea has improved some. This morning she had some scrambled eggs and 1/2 piece of bacon. She reports she is having some difficulty chewing.  UBW was 230 lbs but she is unsure when she started losing weight. No weight history in chart to trend. Current weight likely falsely elevated with edema.  Meal Completion: mainly 0-15%  Medications reviewed and include: Cipro, potassium chloride 40 mEq PO once today.  Labs reviewed: Potassium 3.4, CO2 35.  Patient does not meet criteria for malnutrition at this time but is at risk for malnutrition.  NUTRITION - FOCUSED PHYSICAL EXAM:    Most Recent Value  Orbital Region  No depletion  Upper Arm Region  No depletion  Thoracic and  Lumbar Region  No depletion  Buccal Region  No depletion  Temple Region  Mild depletion  Clavicle Bone Region  Mild depletion  Clavicle and Acromion Bone Region  Mild depletion  Scapular Bone Region  No depletion  Dorsal Hand  Mild depletion  Patellar Region  Unable to assess  Anterior Thigh Region  Unable to assess  Posterior Calf Region  Unable to assess  Edema (RD Assessment)  Moderate  Hair  Reviewed  Eyes  Reviewed  Mouth  Reviewed  Skin  Reviewed  Nails  Reviewed     Diet Order:  Diet regular Room service appropriate? Yes; Fluid consistency: Thin  EDUCATION NEEDS:   No education needs have been identified at this time  Skin:  Skin Assessment: Skin Integrity Issues:(MSAD to bilateral buttocks)  Last BM:  12/21/2017 - large type 6  Height:   Ht Readings from Last 1 Encounters:  12/18/17 '5\' 1"'$  (1.549 m)    Weight:   Wt Readings from Last 1 Encounters:  12/23/17 202 lb 9.6 oz (91.9 kg)    Ideal Body Weight:  47.7 kg  BMI:  Body mass index is 38.28 kg/m.  Estimated Nutritional Needs:   Kcal:  1585-1850 (MSJ x 1.2-1.4)  Protein:  83-92 grams (0.9-1 grams/kg)  Fluid:  1.5-1.8 L/day (1 mL/kcal)  Willey Blade, MS, RD, LDN Office: [phone] Pager: [phone] After Hours/Weekend Pager: [phone]

## 2017-12-23 NOTE — [place] FL2 (Signed)
[place] MEDICAID FL2 LEVEL OF CARE SCREENING TOOL     IDENTIFICATION  Patient Name: Sydney Mitchell Birthdate: Sep 12, 1933 Sex: female Admission Date (Current Location): 12/18/2017  [place]   Facility and Address:  [facility], [address], [place], [place] [address]      Provider Number: [id number]  Attending Physician Name and Address:  Milagros LollSudini, Srikar, MD  Relative Name and Phone Number:  Bonnye FavaSheila Whitla [phone]) Daughter    Current Level of Care: Hospital Recommended Level of Care: Skilled Nursing Facility Prior Approval Number:    Date Approved/Denied: 12/23/17 PASRR Number: [phone]  Discharge Plan: SNF    Current Diagnoses: Patient Active Problem List   Diagnosis Date Noted  . Bradycardia 12/18/2017    Orientation RESPIRATION BLADDER Height & Weight     Self, Time, Situation, Place  O2 Continent Weight: 202 lb 9.6 oz (91.9 kg) Height:  5\' 1"  (154.9 cm)  BEHAVIORAL SYMPTOMS/MOOD NEUROLOGICAL BOWEL NUTRITION STATUS      Continent Diet(Dysphagia 3, thin liquids)  AMBULATORY STATUS COMMUNICATION OF NEEDS Skin   Extensive Assist Verbally Normal                       Personal Care Assistance Level of Assistance  Bathing, Feeding, Dressing Bathing Assistance: Limited assistance Feeding assistance: Independent Dressing Assistance: Limited assistance     Functional Limitations Info             SPECIAL CARE FACTORS FREQUENCY  PT (By licensed PT)     PT Frequency: Up to 5X per week              Contractures Contractures Info: Not present    Additional Factors Info  Code Status, Allergies, Psychotropic Code Status Info: DNR Allergies Info: No Known Allergies Psychotropic Info: Trazodone         Current Medications (12/23/2017):  This is the current hospital active medication list Current Facility-Administered Medications  Medication Dose Route Frequency Provider Last  Rate Last Dose  . acetaminophen (TYLENOL) tablet 650 mg  650 mg Oral Q6H PRN Milagros LollSudini, Srikar, MD   650 mg at 12/21/17 1056   Or  . acetaminophen (TYLENOL) suppository 650 mg  650 mg Rectal Q6H PRN Sudini, Wardell HeathSrikar, MD      . albuterol (PROVENTIL) (2.5 MG/3ML) 0.083% nebulizer solution 2.5 mg  2.5 mg Nebulization Q2H PRN Sudini, Srikar, MD      . ciprofloxacin (CIPRO) tablet 500 mg  500 mg Oral BID Milagros LollSudini, Srikar, MD   500 mg at 12/23/17 0858  . enoxaparin (LOVENOX) injection 40 mg  40 mg Subcutaneous Q24H Erin FullingKasa, Kurian, MD   40 mg at 12/23/17 0901  . feeding supplement (ENSURE ENLIVE) (ENSURE ENLIVE) liquid 237 mL  237 mL Oral BID BM Sudini, Srikar, MD   237 mL at 12/23/17 1340  . iohexol (OMNIPAQUE) 300 MG/ML solution 100 mL  100 mL Intravenous Once PRN Milagros LollSudini, Srikar, MD      . MEDLINE mouth rinse  15 mL Mouth Rinse BID Milagros LollSudini, Srikar, MD   15 mL at 12/23/17 1102  . metroNIDAZOLE (FLAGYL) tablet 500 mg  500 mg Oral Q8H Sudini, Srikar, MD   500 mg at 12/23/17 1340  . [START ON 12/24/2017] multivitamin with minerals tablet 1 tablet  1 tablet Oral Daily Sudini, Srikar, MD      . ondansetron (ZOFRAN) tablet 4 mg  4 mg Oral Q6H PRN Milagros LollSudini, Srikar, MD  Or  . ondansetron (ZOFRAN) injection 4 mg  4 mg Intravenous Q6H PRN Sudini, Wardell Heath, MD      . oxyCODONE-acetaminophen (PERCOCET/ROXICET) 5-325 MG per tablet 1 tablet  1 tablet Oral Q4H PRN Milagros Loll, MD   1 tablet at 12/23/17 0436  . sodium chloride flush (NS) 0.9 % injection 3 mL  3 mL Intravenous Q12H Sudini, Srikar, MD   3 mL at 12/23/17 1018  . traMADol (ULTRAM) tablet 50 mg  50 mg Oral Q6H PRN Milagros Loll, MD   50 mg at 12/23/17 0859  . traZODone (DESYREL) tablet 50 mg  50 mg Oral QHS Milagros Loll, MD   50 mg at 12/22/17 2109     Discharge Medications: Please see discharge summary for a list of discharge medications.  Relevant Imaging Results:  Relevant Lab Results:   Additional Information SS# [id number]  Judi Cong,  LCSW

## 2017-12-23 NOTE — Progress Notes (Signed)
Pt complaining of lower abdominal pain 8 out 10. Per patient " she feels bloated". Dr. Elpidio AnisSudini notified and orders received for mylanta and miralax PRN. Will administer and continue to monitor.

## 2017-12-23 NOTE — Clinical Social Work Note (Signed)
Clinical Social Work Assessment  Patient Details  Name: Sydney Mitchell MRN: [id number] Date of Birth: [birth year]  Date of referral:  12/23/17               Reason for consult:  Facility Placement                Permission sought to share information with:  Oceanographer[place]ility Contact Representative Permission granted to share information::     Name::        Agency::  [place] area SNFs  Relationship::     Contact Information:     Housing/Transportation Living arrangements for the past 2 months:  Single Family Home Source of Information:  Adult Children, Medical Team Patient Interpreter Needed:  None Criminal Activity/Legal Involvement Pertinent to Current Situation/Hospitalization:  No - Comment as needed Significant Relationships:  Adult Children Lives with:  Adult Children Do you feel safe going back to the place where you live?  Yes Need for family participation in patient care:  Yes (Comment)(Patient was sleeping)  Care giving concerns:  PT recommendation for SNF   Social Worker assessment / plan: The CSW attempted to visit the patient and family at bedside; however, the patient was sleeping soundly and no family was available. The CSW contacted the patient's daughter Velna HatchetSheila by phone to discuss discharge planning. The CSW introduced self and role in care. Velna HatchetSheila gave verbal consent to begin the referral process and asked that the placement be in [place]. The CSW explained the referral process.  The patient will discharge tomorrow or Tuesday. The patient will need a prior authorization from [facility] Medicare PPO. The CSW will follow up with bed offers when they are available.  Employment status:  Retired Database administratornsurance information:  Managed Medicare PT Recommendations:  Skilled Nursing Facility Information / Referral to community resources:  Skilled Nursing Facility  Patient/Family's Response to care:  The patient's daughter thanked the CSW for assistance.  Patient/Family's Understanding  of and Emotional Response to Diagnosis, Current Treatment, and Prognosis:  The patient's family understands the discharge plan and agree with it.  Emotional Assessment Appearance:  Appears stated age Attitude/Demeanor/Rapport:  Lethargic Affect (typically observed):  (Patient was sleeping) Orientation:  (Baseline Self, Place, Situation, fluctuates on time. Currently Patient was sleeping.) Alcohol / Substance use:  Never Used Psych involvement (Current and /or in the community):  No (Comment)  Discharge Needs  Concerns to be addressed:  Care Coordination, Discharge Planning Concerns Readmission within the last 30 days:  No Current discharge risk:  None Barriers to Discharge:  Continued Medical Work up   UAL CorporationKaren M Dominik Yordy, LCSW 12/23/2017, 1:43 PM

## 2017-12-24 ENCOUNTER — Encounter: Payer: Self-pay | Admitting: Radiology

## 2017-12-24 ENCOUNTER — Inpatient Hospital Stay: Payer: Medicare PPO

## 2017-12-24 LAB — BASIC METABOLIC PANEL
ANION GAP: 6 (ref 5–15)
BUN: 22 mg/dL — ABNORMAL HIGH (ref 6–20)
CO2: 38 mmol/L — ABNORMAL HIGH (ref 22–32)
CREATININE: 0.64 mg/dL (ref 0.44–1.00)
Calcium: 8.9 mg/dL (ref 8.9–10.3)
Chloride: 101 mmol/L (ref 101–111)
GFR calc non Af Amer: >60 mL/min (ref >60)
Glucose, Bld: 124 mg/dL — ABNORMAL HIGH (ref 65–99)
POTASSIUM: 3.9 mmol/L (ref 3.5–5.1)
SODIUM: 145 mmol/L (ref 135–145)

## 2017-12-24 LAB — MAGNESIUM: MAGNESIUM: 1.8 mg/dL (ref 1.7–2.4)

## 2017-12-24 MED ORDER — IOPAMIDOL (ISOVUE-300) INJECTION 61%
15.0000 mL | INTRAVENOUS | Status: AC
  Administered 2017-12-24 (×2): 15 mL via ORAL

## 2017-12-24 MED ORDER — IOHEXOL 300 MG/ML  SOLN
100.0000 mL | Freq: Once | INTRAMUSCULAR | Status: AC | PRN
  Administered 2017-12-24: 100 mL via INTRAVENOUS

## 2017-12-24 MED ORDER — LISINOPRIL 20 MG PO TABS
40.0000 mg | ORAL_TABLET | Freq: Every day | ORAL | Status: DC
  Administered 2017-12-24 – 2017-12-25 (×2): 40 mg via ORAL
  Filled 2017-12-24 (×2): qty 2

## 2017-12-24 NOTE — Progress Notes (Signed)
[facility] [place] [facility]   PATIENT NAME: Sydney Mitchell    MR#:  [id number]  DATE OF BIRTH:  02-Sep-1934  SUBJECTIVE:  CHIEF COMPLAINT:   Chief Complaint  Patient presents with  . Bradycardia  . Fall   Still has pain all over.  REVIEW OF SYSTEMS:    Review of Systems  Constitutional: Positive for malaise/fatigue. Negative for chills and fever.  HENT: Negative for sore throat.   Eyes: Negative for blurred vision, double vision and pain.  Respiratory: Positive for cough. Negative for hemoptysis, shortness of breath and wheezing.   Cardiovascular: Negative for chest pain, palpitations, orthopnea and leg swelling.  Gastrointestinal: Positive for abdominal pain and diarrhea. Negative for constipation, heartburn, nausea and vomiting.  Genitourinary: Negative for dysuria and hematuria.  Musculoskeletal: Negative for back pain and joint pain.  Skin: Negative for rash.  Neurological: Negative for sensory change, speech change, focal weakness and headaches.  Endo/Heme/Allergies: Does not bruise/bleed easily.  Psychiatric/Behavioral: Negative for depression. The patient is not nervous/anxious.    DRUG ALLERGIES:  No Known Allergies  VITALS:  Blood pressure (!) 146/67, pulse 75, temperature 98.4 F (36.9 C), temperature source Oral, resp. rate 18, height 5\' 1"  (1.549 m), weight 91.2 kg (201 lb), SpO2 92 %.  PHYSICAL EXAMINATION:   Physical Exam  GENERAL:  84 y.o.-year-old patient lying in the bed with no acute distress.  EYES: Pupils equal, round, reactive to light and accommodation. No scleral icterus. Extraocular muscles intact.  HEENT: Head atraumatic, normocephalic. Oropharynx and nasopharynx clear.  NECK:  Supple, no jugular venous distention. No thyroid enlargement, no tenderness.  LUNGS: Normal breath sounds bilaterally, no wheezing, rales, rhonchi. No use of accessory muscles of respiration.  CARDIOVASCULAR: S1, S2 normal. No murmurs, rubs, or  gallops.  ABDOMEN: Soft, nontender, nondistended. Bowel sounds present. No organomegaly or mass.  EXTREMITIES: No cyanosis, clubbing or edema b/l.    NEUROLOGIC: Cranial nerves II through XII are intact. Generalized weakness PSYCHIATRIC: The patient is alert and oriented x 3.  SKIN: No obvious rash, lesion, or ulcer.   LABORATORY PANEL:   CBC Recent Labs  Lab 12/19/17 0407  WBC 18.5*  HGB 11.1*  HCT 34.1*  PLT 415   ------------------------------------------------------------------------------------------------------------------ Chemistries  Recent Labs  Lab 12/19/17 0407  12/24/17 0440  NA 138   < > 145  K 4.0   < > 3.9  CL 104   < > 101  CO2 28   < > 38*  GLUCOSE 125*   < > 124*  BUN 18   < > 22*  CREATININE 0.90   < > 0.64  CALCIUM 8.1*   < > 8.9  MG  --    < > 1.8  AST 38  --   --   ALT 27  --   --   ALKPHOS 75  --   --   BILITOT 0.7  --   --    < > = values in this interval not displayed.   ------------------------------------------------------------------------------------------------------------------  Cardiac Enzymes Recent Labs  Lab 12/19/17 0407  TROPONINI 0.62*   ------------------------------------------------------------------------------------------------------------------  RADIOLOGY:  No results found.   ASSESSMENT AND PLAN:   *Sepsis secondary to colitis. On IV antibiotics.  Changed to oral antibiotics  C. difficile and stool PCR negative  *Bradycardia.  Etiology unclear.  Could be due to electrolyte imbalances with hypokalemia and hypomagnesemia  Temporary pacer removed.  Patient in normal sinus rhythm.  *Osteoarthritis.  Pain medications as needed.  *  DVT prophylaxis with Lovenox  *Generalized weakness.  Physical therapy evaluation.  Skilled nursing facility at discharge.  *Acute hypoxic respiratory failure likely due to atelectasis.  Suggested that we get a repeat chest x-ray but patient refused  D/C SNF tomorrow   All the  records are reviewed and case discussed with Care Management/Social Worker Management plans discussed with the patient, family and they are in agreement.  CODE STATUS: DNR  DVT Prophylaxis: SCDs  TOTAL TIME TAKING CARE OF THIS PATIENT: 30 minutes.   POSSIBLE D/C IN 1-2 DAYS, DEPENDING ON CLINICAL CONDITION.  Molinda BailiffSrikar R Sherrin Stahle M.D on 12/24/2017 at 1:41 PM  Between 7am to 6pm - Pager - [phone]  After 6pm go to [email or web address] - password [email or web address]  [facility] [place] [facility]  Office  [phone]  CC: Primary care physician; Patient, No Pcp Per  Note: This dictation was prepared with Dragon dictation along with smaller phrase technology. Any transcriptional errors that result from this process are unintentional.

## 2017-12-24 NOTE — Clinical Social Work Note (Addendum)
CSW spoke with patient's daughter Velna HatchetSheila [phone] and presented bed offers, patient's daughter chose [facility], CSW updated insurance company, awaiting insurance authorization.  CSW to continue to follow patient's progress throughout discharge planning.  4:15pm  CSW received phone call from patient's insurance company that they have approved patient to go to SNF for short term rehab at [facility].  Patient's authorization number is [id number] effective 12-27-17, CSW updated patient's daughter and [facility].  Patient is able to discharge to SNF once she is medically ready for discharge and orders have been received.  Ervin KnackEric R. Roldan Laforest, MSW, Theresia MajorsLCSWA [phone]  12/24/2017 1:14 PM

## 2017-12-24 NOTE — Care Management Important Message (Signed)
Copy of signed IM left in patient's room.    

## 2017-12-24 NOTE — Progress Notes (Signed)
Physical Therapy Treatment Patient Details Name: Sydney Mitchell MRN: [id number] DOB: 1933/10/26 Today's Date: 12/24/2017    History of Present Illness Pt is a 84 y/o F who presented after dizziness and a fall.  Found to be bradycardic.  Temporary pacemaker placed with pulse lost requiring CPR.  Temporary pacemaker since removed.  Episode suspected to be due to electrolyte imbalance.  Pt's PMH includes arthritis.  Pt with L hip pain (baseline due to OA) and imaging of L hip negative.     PT Comments    Pt presents with deficits in strength, transfers, mobility, gait, balance, and activity tolerance.  Pt required +2 extensive assistance with bed mobility training including rolling and sup to/from sit with cues required for sequencing.  Multiple attempts made to stand at EOB with pt able to unweight bottom from the EOB but unable to clear the bed completely even with +2 assist.  Pt remains significantly weaker functionally compared to stated baseline and will benefit from PT services in a SNF setting upon discharge to safely address above deficits for decreased caregiver assistance and eventual return to PLOF.      Follow Up Recommendations  SNF     Equipment Recommendations  None recommended by PT    Recommendations for Other Services       Precautions / Restrictions Precautions Precautions: Fall;Other (comment) Precaution Comments: O2, montior HR Restrictions Weight Bearing Restrictions: No    Mobility  Bed Mobility Overal bed mobility: Needs Assistance Bed Mobility: Supine to Sit;Sit to Supine     Supine to sit: Max assist;+2 for physical assistance Sit to supine: +2 for physical assistance;Max assist   General bed mobility comments: Extensive +2 physical assistance along with mod verbal cues for sequencing during bed mobility tasks.  Transfers Overall transfer level: Needs assistance Equipment used: Rolling walker (2 wheeled) Transfers: Sit to/from Stand Sit to Stand:  Max assist;+2 physical assistance         General transfer comment: Multiple attempts at sit to/from stand from the EOB with pt unable to clear surface of bed  Ambulation/Gait             General Gait Details: unable at this time   Stairs             Wheelchair Mobility    Modified Rankin (Stroke Patients Only)       Balance Overall balance assessment: Needs assistance Sitting-balance support: No upper extremity supported;Feet supported Sitting balance-Leahy Scale: Fair         Standing balance comment: Unable to stand                            Cognition Arousal/Alertness: Awake/alert Behavior During Therapy: WFL for tasks assessed/performed Overall Cognitive Status: Within Functional Limits for tasks assessed                                        Exercises Total Joint Exercises Ankle Circles/Pumps: AROM;Both;5 reps;10 reps Quad Sets: Both;5 reps;10 reps;Strengthening Gluteal Sets: Strengthening;Both;10 reps Hip ABduction/ADduction: AAROM;Both;5 reps Straight Leg Raises: AAROM;Both;5 reps Long Arc Quad: AROM;Both;10 reps Knee Flexion: AROM;Both;10 reps    General Comments        Pertinent Vitals/Pain Pain Assessment: 0-10 Pain Score: 10-Worst pain ever Pain Location: L hip (h/o arthritis) Pain Descriptors / Indicators: Aching;Grimacing;Guarding;Moaning Pain Intervention(s): Premedicated before session;Monitored during session;Limited activity  within patient's tolerance    Home Living                      Prior Function            PT Goals (current goals can now be found in the care plan section)      Frequency    Min 2X/week      PT Plan Current plan remains appropriate    Co-evaluation              AM-PAC PT "6 Clicks" Daily Activity  Outcome Measure                   End of Session Equipment Utilized During Treatment: Gait belt;Oxygen Activity Tolerance: Patient  limited by fatigue;Patient limited by pain Patient left: in bed;with call bell/phone within reach;with bed alarm set Nurse Communication: Mobility status PT Visit Diagnosis: Muscle weakness (generalized) (M62.81);History of falling (Z91.81);Unsteadiness on feet (R26.81);Other abnormalities of gait and mobility (R26.89)     Time: 1050(Plus 11:06-11:22 with break for hygiene)-1100 PT Time Calculation (min) (ACUTE ONLY): 10 min  Charges:  $Therapeutic Exercise: 8-22 mins $Therapeutic Activity: 8-22 mins                    G Codes:       DElly Modena PT, DPT 12/24/17, 12:19 PM

## 2017-12-24 NOTE — Progress Notes (Signed)
Clinical Social Worker (CSW) attempted to contact patient's daughter Velna HatchetSheila to present bed offers. She did not answer and a voicemail was left. CSW started [facility] through [place] [facility].   Baker Hughes IncorporatedBailey Elijahjames Fuelling, LCSW [phone]

## 2017-12-24 NOTE — Progress Notes (Signed)
Patient elevated BP, consistently high last several readings. Dr. Elpidio AnisSudini notified. Order for 40mg  lisinopril PO daily.

## 2017-12-24 NOTE — Progress Notes (Signed)
Pharmacy Electrolyte Monitoring Consult:  Pharmacy consulted to assist in monitoring and replacing electrolytes in this 84 y.o. female admitted on 12/18/2017 with Bradycardia and Fall   Labs:  Sodium (mmol/L)  Date Value  12/24/2017 145   Potassium (mmol/L)  Date Value  12/24/2017 3.9   Magnesium (mg/dL)  Date Value  82/95/621304/22/2019 1.8   Phosphorus (mg/dL)  Date Value  08/65/784604/18/2019 3.0   Calcium (mg/dL)  Date Value  96/29/528404/22/2019 8.9   Albumin (g/dL)  Date Value  13/24/401004/17/2019 3.0 (L)    Plan: Targeted electrolytes WNL  BMET in am    Pharmacy will continue to monitor and adjust per consult.   Lowella BandyRodney D Shaine Mount 12/24/2017 12:50 PM

## 2017-12-24 NOTE — Clinical Social Work Note (Addendum)
CSW received phone call from [facility], they are requesting updated PT notes, CSW contacted PT, and he will try to see patient for updated clinical information.  12:40pm  CSW faxed updated PT note to [facility], awaiting for insurance authorization.  Ervin KnackEric R. Emmaclaire Switala, MSW, Theresia MajorsLCSWA [phone]  12/24/2017 10:07 AM

## 2017-12-25 LAB — BASIC METABOLIC PANEL
Anion gap: 4 — ABNORMAL LOW (ref 5–15)
BUN: 23 mg/dL — AB (ref 6–20)
CO2: 40 mmol/L — AB (ref 22–32)
Calcium: 8.4 mg/dL — ABNORMAL LOW (ref 8.9–10.3)
Chloride: 98 mmol/L — ABNORMAL LOW (ref 101–111)
Creatinine, Ser: 0.73 mg/dL (ref 0.44–1.00)
GFR calc Af Amer: >60 mL/min (ref >60)
GFR calc non Af Amer: >60 mL/min (ref >60)
GLUCOSE: 104 mg/dL — AB (ref 65–99)
POTASSIUM: 3.5 mmol/L (ref 3.5–5.1)
Sodium: 142 mmol/L (ref 135–145)

## 2017-12-25 MED ORDER — METRONIDAZOLE 500 MG PO TABS: 500.0000 mg | ORAL_TABLET | Freq: Three times a day (TID) | ORAL | 0 refills | Status: AC

## 2017-12-25 MED ORDER — LISINOPRIL 40 MG PO TABS: 40.0000 mg | ORAL_TABLET | Freq: Every day | ORAL | 0 refills | Status: AC

## 2017-12-25 MED ORDER — CIPROFLOXACIN HCL 500 MG PO TABS: 500.0000 mg | ORAL_TABLET | Freq: Two times a day (BID) | ORAL | 0 refills | Status: AC

## 2017-12-25 MED ORDER — TRAMADOL HCL 50 MG PO TABS: 50.0000 mg | ORAL_TABLET | Freq: Four times a day (QID) | ORAL | 0 refills | Status: AC | PRN

## 2017-12-25 MED ORDER — ENSURE ENLIVE PO LIQD: 237.0000 mL | Freq: Two times a day (BID) | ORAL | 12 refills | Status: AC

## 2017-12-25 MED ORDER — TRAZODONE HCL 50 MG PO TABS: 50.0000 mg | ORAL_TABLET | Freq: Every day | ORAL | 0 refills | Status: AC

## 2017-12-25 NOTE — Progress Notes (Signed)
Report given to Sydney LandAngela at [facility]. EMS called.

## 2017-12-25 NOTE — Plan of Care (Signed)

## 2017-12-25 NOTE — Progress Notes (Signed)
Pharmacy Electrolyte Monitoring Consult:  Pharmacy consulted to assist in monitoring and replacing electrolytes in this 84 y.o. female admitted on 12/18/2017 with Bradycardia and Fall   Labs:  Sodium (mmol/L)  Date Value  12/25/2017 142   Potassium (mmol/L)  Date Value  12/25/2017 3.5   Magnesium (mg/dL)  Date Value  78/29/562104/22/2019 1.8   Phosphorus (mg/dL)  Date Value  30/86/578404/18/2019 3.0   Calcium (mg/dL)  Date Value  69/62/952804/23/2019 8.4 (L)   Albumin (g/dL)  Date Value  41/32/440104/17/2019 3.0 (L)    Plan: Electrolytes stable. Pharmacy will sign off    Pharmacy will continue to monitor and adjust per consult.   Olene FlossMelissa D Margarito Dehaas 12/25/2017 8:25 AM

## 2017-12-25 NOTE — Clinical Social Work Note (Signed)
Patient to be d/c'ed today to [facility].  Patient and family agreeable to plans will transport via ems RN to call report.  Patient's daughter Velna HatchetSheila was made aware that patient is discharging today.  Windell MouldingEric Jackelynn Hosie, MSW, Theresia MajorsLCSWA [phone]

## 2017-12-25 NOTE — Progress Notes (Signed)
IV taken out. Patient given discharge instruction. Patient verbalized understanding. Patient transported to [facility] via EMS in stable condition.

## 2017-12-25 NOTE — Clinical Social Work Placement (Signed)
   CLINICAL SOCIAL WORK PLACEMENT  NOTE  Date:  12/25/2017  Patient Details  Name: Sydney Mitchell MRN: [id number] Date of Birth: [birth year]  Clinical Social Work is seeking post-discharge placement for this patient at the Skilled  Nursing Facility level of care (*CSW will initial, date and re-position this form in  chart as items are completed):  Yes   Patient/family provided with [facility] Clinical Social Work Department's list of facilities offering this level of care within the geographic area requested by the patient (or if unable, by the patient's family).  Yes   Patient/family informed of their freedom to choose among providers that offer the needed level of care, that participate in Medicare, Medicaid or managed care program needed by the patient, have an available bed and are willing to accept the patient.  Yes   Patient/family informed of [facility]'s ownership interest in [facility] and [facility], as well as of the fact that they are under no obligation to receive care at these facilities.  PASRR submitted to EDS on 12/23/17     PASRR number received on 12/23/17     Existing PASRR number confirmed on       FL2 transmitted to all facilities in geographic area requested by pt/family on 12/23/17     FL2 transmitted to all facilities within larger geographic area on       Patient informed that his/her managed care company has contracts with or will negotiate with certain facilities, including the following:        Yes   Patient/family informed of bed offers received.  Patient chooses bed at [facility]     Physician recommends and patient chooses bed at      Patient to be [facility] to [facility]  on 12/25/17.  Patient to be [facility] to [facility]     Patient family notified on 12/25/17 of transfer.  Name of family member notified:  Velna HatchetSheila patient's daughter     PHYSICIAN Please sign DNR, Please  sign FL2     Additional Comment:    _______________________________________________ Darleene CleaverAnterhaus, Alandra Sando R, LCSWA 12/25/2017, 6:26 PM

## 2017-12-25 NOTE — Discharge Summary (Signed)
[facility] [place] at [facility]   PATIENT NAME: Sydney MonksCarol Mitchell    MR#:  [id number]  DATE OF BIRTH:  [birth year]  DATE OF ADMISSION:  12/18/2017 ADMITTING PHYSICIAN: Milagros LollSrikar Nachman Sundt, MD  DATE OF DISCHARGE: 12/25/2017  PRIMARY CARE PHYSICIAN: Patient, No Pcp Per   ADMISSION DIAGNOSIS:  Bradycardia [R00.1] Heart block [I45.9] Elevated lactic acid level [R79.89] Fall, initial encounter [W19.XXXA] Sepsis, due to unspecified organism (HCC) [A41.9]  DISCHARGE DIAGNOSIS:  Active Problems:   Bradycardia   SECONDARY DIAGNOSIS:   Past Medical History:  Diagnosis Date  . Osteoarthritis      ADMITTING HISTORY  HISTORY OF PRESENT ILLNESS:  Sydney MonksCarol Mitchell  is a 84 y.o. female with a known history of osteoarthritis presented to the hospital after patient had dizziness and fall.  EMS was called.  Patient was found to be bradycardic in the 20s.  A temporary pacemaker was placed.  Pulse was lost in the middle and she had CPR for 2-3 minutes.  Regained circulation.  Patient was alert and oriented when she arrived to the emergency room.  Due to bradycardia patient had a temporary pacemaker placed by Dr. Lady GaryFath. Patient has had diarrhea for a week of watery 5-6 episodes of stool daily with abdominal pain and nausea.  No blood in stool.  No recent antibiotic use.  Afebrile.  Patient does have elevated white count of 22.6.  Lactic acid at 5.9.  HOSPITAL COURSE:    *Sepsis secondary to colitis. Was On IV antibiotics.  Changed to oral antibiotics .  Continue orally for 4 more days after discharge C. difficile and stool PCR negative Repeat CT scan shows no worsening.  *Bradycardia. Etiology unclear.  Could be due to electrolyte imbalances with hypokalemia and hypomagnesemia Temporary pacer removed.  Patient in normal sinus rhythm.  *Osteoarthritis. Pain medications as needed.  *DVT prophylaxis with Lovenox  *Generalized weakness.  Physical therapy evaluation.    Skilled nursing facility at discharge.  *Acute hypoxic respiratory failure likely due to atelectasis.  Suggested that we get a repeat chest x-ray but patient refused. Continue oxygen 2 L/min.  Wean as tolerated  Patient stable for discharge to skilled nursing facility.   CONSULTS OBTAINED:  Treatment Team:  Dalia HeadingFath, Kenneth A, MD  DRUG ALLERGIES:  No Known Allergies  DISCHARGE MEDICATIONS:   Allergies as of 12/25/2017   No Known Allergies     Medication List    TAKE these medications   ciprofloxacin 500 MG tablet Commonly known as:  CIPRO Take 1 tablet (500 mg total) by mouth 2 (two) times daily.   feeding supplement (ENSURE ENLIVE) Liqd Take 237 mLs by mouth 2 (two) times daily between meals.   lisinopril 40 MG tablet Commonly known as:  PRINIVIL,ZESTRIL Take 1 tablet (40 mg total) by mouth daily. Start taking on:  12/26/2017   metroNIDAZOLE 500 MG tablet Commonly known as:  FLAGYL Take 1 tablet (500 mg total) by mouth every 8 (eight) hours.   traMADol 50 MG tablet Commonly known as:  ULTRAM Take 1 tablet (50 mg total) by mouth every 6 (six) hours as needed for moderate pain or severe pain.   traZODone 50 MG tablet Commonly known as:  DESYREL Take 1 tablet (50 mg total) by mouth at bedtime.       Today   VITAL SIGNS:  Blood pressure (!) 157/68, pulse 68, temperature (!) 97.5 F (36.4 C), temperature source Oral, resp. rate 19, height 5\' 1"  (1.549 m), weight 91.3 kg (201 lb  3.2 oz), SpO2 93 %.  I/O:    Intake/Output Summary (Last 24 hours) at 12/25/2017 1240 Last data filed at 12/25/2017 0905 Gross per 24 hour  Intake 3 ml  Output -  Net 3 ml    PHYSICAL EXAMINATION:  Physical Exam  GENERAL:  84 y.o.-year-old patient lying in the bed with no acute distress.  LUNGS: Normal breath sounds bilaterally, no wheezing, rales,rhonchi or crepitation. No use of accessory muscles of respiration.  CARDIOVASCULAR: S1, S2 normal. No murmurs, rubs, or gallops.   ABDOMEN: Soft, non-tender, non-distended. Bowel sounds present. No organomegaly or mass.  NEUROLOGIC: Moves all 4 extremities. PSYCHIATRIC: The patient is alert and oriented x 3.  SKIN: No obvious rash, lesion, or ulcer.   DATA REVIEW:   CBC Recent Labs  Lab 12/19/17 0407  WBC 18.5*  HGB 11.1*  HCT 34.1*  PLT 415    Chemistries  Recent Labs  Lab 12/19/17 0407  12/24/17 0440 12/25/17 0354  NA 138   < > 145 142  K 4.0   < > 3.9 3.5  CL 104   < > 101 98*  CO2 28   < > 38* 40*  GLUCOSE 125*   < > 124* 104*  BUN 18   < > 22* 23*  CREATININE 0.90   < > 0.64 0.73  CALCIUM 8.1*   < > 8.9 8.4*  MG  --    < > 1.8  --   AST 38  --   --   --   ALT 27  --   --   --   ALKPHOS 75  --   --   --   BILITOT 0.7  --   --   --    < > = values in this interval not displayed.    Cardiac Enzymes Recent Labs  Lab 12/19/17 0407  TROPONINI 0.62*    Microbiology Results  Results for orders placed or performed during the hospital encounter of 12/18/17  Blood culture (routine x 2)     Status: Abnormal   Collection Time: 12/18/17  4:54 PM  Result Value Ref Range Status   Specimen Description   Final    BLOOD RIGHT ANTECUBITAL Performed at [facility], [address], [place], [place] [address]    Special Requests   Final    BOTTLES DRAWN AEROBIC AND ANAEROBIC Blood Culture adequate volume Performed at [facility], [address] [place], [place] [address]    Culture  Setup Time   Final    Organism ID to follow GRAM POSITIVE COCCI AEROBIC BOTTLE ONLY CRITICAL RESULT CALLED TO, READ BACK BY AND VERIFIED WITH: JASON ROBBINS ON 12/19/17 AT 2016 JAG Performed at [facility], [address] [place], [place] [address]    Culture (A)  Final    STAPHYLOCOCCUS SPECIES (COAGULASE NEGATIVE) THE SIGNIFICANCE OF ISOLATING THIS ORGANISM FROM A SINGLE SET OF BLOOD CULTURES WHEN MULTIPLE SETS ARE DRAWN IS UNCERTAIN. PLEASE NOTIFY THE MICROBIOLOGY DEPARTMENT  WITHIN ONE WEEK IF SPECIATION AND SENSITIVITIES ARE REQUIRED. Performed at [facility], [address] [place], [place] [address]    Report Status 12/21/2017 FINAL  Final  Blood culture (routine x 2)     Status: None   Collection Time: 12/18/17  4:54 PM  Result Value Ref Range Status   Specimen Description BLOOD LEFT ANTECUBITAL  Final   Special Requests   Final    BOTTLES DRAWN AEROBIC AND ANAEROBIC Blood Culture  adequate volume   Culture   Final    NO GROWTH 5 DAYS Performed at [facility], [address] [place], [place] [address]    Report Status 12/23/2017 FINAL  Final  Blood Culture ID Panel (Reflexed)     Status: Abnormal   Collection Time: 12/18/17  4:54 PM  Result Value Ref Range Status   Enterococcus species NOT DETECTED NOT DETECTED Final   Listeria monocytogenes NOT DETECTED NOT DETECTED Final   Staphylococcus species DETECTED (A) NOT DETECTED Final    Comment: Methicillin (oxacillin) susceptible coagulase negative staphylococcus. Possible blood culture contaminant (unless isolated from more than one blood culture draw or clinical case suggests pathogenicity). No antibiotic treatment is indicated for blood  culture contaminants. CRITICAL RESULT CALLED TO, READ BACK BY AND VERIFIED WITH: JASON ROBBINS ON 12/19/17 AT 2016 BY JAG    Staphylococcus aureus NOT DETECTED NOT DETECTED Final   Methicillin resistance NOT DETECTED NOT DETECTED Final   Streptococcus species NOT DETECTED NOT DETECTED Final   Streptococcus agalactiae NOT DETECTED NOT DETECTED Final   Streptococcus pneumoniae NOT DETECTED NOT DETECTED Final   Streptococcus pyogenes NOT DETECTED NOT DETECTED Final   Acinetobacter baumannii NOT DETECTED NOT DETECTED Final   Enterobacteriaceae species NOT DETECTED NOT DETECTED Final   Enterobacter cloacae complex NOT DETECTED NOT DETECTED Final   Escherichia coli NOT DETECTED NOT DETECTED Final   Klebsiella oxytoca NOT DETECTED NOT DETECTED Final    Klebsiella pneumoniae NOT DETECTED NOT DETECTED Final   Proteus species NOT DETECTED NOT DETECTED Final   Serratia marcescens NOT DETECTED NOT DETECTED Final   Haemophilus influenzae NOT DETECTED NOT DETECTED Final   Neisseria meningitidis NOT DETECTED NOT DETECTED Final   Pseudomonas aeruginosa NOT DETECTED NOT DETECTED Final   Candida albicans NOT DETECTED NOT DETECTED Final   Candida glabrata NOT DETECTED NOT DETECTED Final   Candida krusei NOT DETECTED NOT DETECTED Final   Candida parapsilosis NOT DETECTED NOT DETECTED Final   Candida tropicalis NOT DETECTED NOT DETECTED Final    Comment: Performed at [facility], [address] [place], [place] [address]  MRSA PCR Screening     Status: None   Collection Time: 12/18/17  9:07 PM  Result Value Ref Range Status   MRSA by PCR NEGATIVE NEGATIVE Final    Comment:        The GeneXpert MRSA Assay (FDA approved for NASAL specimens only), is one component of a comprehensive MRSA colonization surveillance program. It is not intended to diagnose MRSA infection nor to guide or monitor treatment for MRSA infections. Performed at [facility], [address] [place], [place] [address]   C difficile quick scan w PCR reflex     Status: None   Collection Time: 12/18/17  9:37 PM  Result Value Ref Range Status   C Diff antigen NEGATIVE NEGATIVE Final   C Diff toxin NEGATIVE NEGATIVE Final   C Diff interpretation No C. difficile detected.  Final    Comment: VALID Performed at [facility], [address] [place], [place] [address]   Gastrointestinal Panel by PCR , Stool     Status: None   Collection Time: 12/18/17  9:37 PM  Result Value Ref Range Status   Campylobacter species NOT DETECTED NOT DETECTED Final   Plesimonas shigelloides NOT DETECTED NOT DETECTED Final   Salmonella species NOT DETECTED NOT DETECTED Final   Yersinia enterocolitica NOT DETECTED NOT DETECTED Final   Vibrio species NOT DETECTED  NOT DETECTED  Final   Vibrio cholerae NOT DETECTED NOT DETECTED Final   Enteroaggregative E coli (EAEC) NOT DETECTED NOT DETECTED Final   Enteropathogenic E coli (EPEC) NOT DETECTED NOT DETECTED Final   Enterotoxigenic E coli (ETEC) NOT DETECTED NOT DETECTED Final   Shiga like toxin producing E coli (STEC) NOT DETECTED NOT DETECTED Final   Shigella/Enteroinvasive E coli (EIEC) NOT DETECTED NOT DETECTED Final   Cryptosporidium NOT DETECTED NOT DETECTED Final   Cyclospora cayetanensis NOT DETECTED NOT DETECTED Final   Entamoeba histolytica NOT DETECTED NOT DETECTED Final   Giardia lamblia NOT DETECTED NOT DETECTED Final   Adenovirus F40/41 NOT DETECTED NOT DETECTED Final   Astrovirus NOT DETECTED NOT DETECTED Final   Norovirus GI/GII NOT DETECTED NOT DETECTED Final   Rotavirus A NOT DETECTED NOT DETECTED Final   Sapovirus (I, II, IV, and V) NOT DETECTED NOT DETECTED Final    Comment: Performed at [facility], [address] [place], [place] [address]    RADIOLOGY:  Ct Abdomen Pelvis W Contrast  Result Date: 12/24/2017 CLINICAL DATA:  Abdominal pain and diarrhea EXAM: CT ABDOMEN AND PELVIS WITH CONTRAST TECHNIQUE: Multidetector CT imaging of the abdomen and pelvis was performed using the standard protocol following bolus administration of intravenous contrast. CONTRAST:  OMNIPAQUE IOHEXOL 300 MG/ML  SOLN COMPARISON:  12/18/2017 FINDINGS: Lower chest: Heart is top-normal in size. Three-vessel coronary arteriosclerosis is noted. Mild-to-moderate aortic atherosclerosis without dissection or aneurysm. No pericardial effusion. Dense right basilar atelectasis. Interval slight increase in small left effusion with compressive atelectasis at the left lung base. Hepatobiliary: Mild reservoir effect status post cholecystectomy with slight intra and extrahepatic ductal dilatation. No choledocholithiasis. Homogeneous attenuation of the liver without mass. Pancreas: No ductal dilatation, mass  or inflammation. Spleen: Normal Adrenals/Urinary Tract: Stable mild thickening of the left adrenal gland. No focal adrenal mass. No nephrolithiasis. Stable water attenuating cysts of the right kidney, the largest is interpolar measuring 12 mm. Mild hydroureteronephrosis is noted bilaterally, new since prior exam without obstructive uropathy or mass. Findings are likely related to distention of the urinary bladder. Bladder diverticulosis is noted bilaterally. Stomach/Bowel: Small hiatal hernia. Decompressed stomach. No small bowel obstruction or inflammation. Normal contrast filled appendix. Scattered colonic diverticulosis along the ascending and transverse colon with greater degree of colonic diverticulosis along the left colon. Pericolonic inflammation is noted along the sigmoid colon compatible with acute uncomplicated diverticulitis. No bowel obstruction or abscess. Vascular/Lymphatic: Aortic atherosclerosis. No enlarged abdominal or pelvic lymph nodes. Patent splenic and portal veins. Patent branch vessels off the aorta. Reproductive: Status post hysterectomy. No adnexal masses. Other: No abdominal wall hernia or abnormality. No abdominopelvic ascites. Musculoskeletal: Stable degenerative change along the thoracolumbar spine. No aggressive osseous lesions. IMPRESSION: 1. Redemonstration of a long segment of diverticulitis involving the sigmoid colon without complication. No bowel obstruction, abscess, or free air. Findings are similar in appearance. 2. Bibasilar atelectasis with slight increase in small left effusion. 3. Three-vessel coronary arteriosclerosis. Aortic atherosclerosis without aneurysm. 4. Diverticulosis of the bladder. Electronically Signed   By: Tollie Eth M.D.   On: 12/24/2017 21:33    Follow up with PCP in 1 week.  Management plans discussed with the patient, family and they are in agreement.  CODE STATUS:     Code Status Orders  (From admission, onward)        Start      Ordered   12/18/17 1818  Do not attempt resuscitation (DNR)  Continuous    Question Answer Comment  In the event of cardiac or respiratory ARREST Do not call a "code blue"   In the event of cardiac or respiratory ARREST Do not perform Intubation, CPR, defibrillation or ACLS   In the event of cardiac or respiratory ARREST Use medication by any route, position, wound care, and other measures to relive pain and suffering. May use oxygen, suction and manual treatment of airway obstruction as needed for comfort.      12/18/17 1819    Code Status History    This patient has a current code status but no historical code status.      TOTAL TIME TAKING CARE OF THIS PATIENT ON DAY OF DISCHARGE: more than 30 minutes.   Molinda Bailiff Alizaya Oshea M.D on 12/25/2017 at 12:40 PM  Between 7am to 6pm - Pager - [phone]  After 6pm go to [email or web address] - password [email or web address]  [facility]  Office  [phone]  CC: Primary care physician; Patient, No Pcp Per  Note: This dictation was prepared with Dragon dictation along with smaller phrase technology. Any transcriptional errors that result from this process are unintentional.

## 2019-01-26 IMAGING — CT CT ABD-PELV W/ CM
2 of 6 series · 15 of 46 positions shown, 17 images · IV contrast (APPLIED)
Comparison: 12/18/2017

CLINICAL DATA: Abdominal pain and diarrhea

EXAM:
CT ABDOMEN AND PELVIS WITH CONTRAST
TECHNIQUE: Multidetector CT imaging of the abdomen and pelvis was performed
using the standard protocol following bolus administration of
intravenous contrast.
CONTRAST:  100mL OMNIPAQUE IOHEXOL 300 MG/ML  SOLN

[Series 2: routine abd/pel with · axial · 0.87mm/px · z∈[-1038,-553]mm · 12 of 111 slices shown, 14 images]
[im 7/111  soft-tissue]
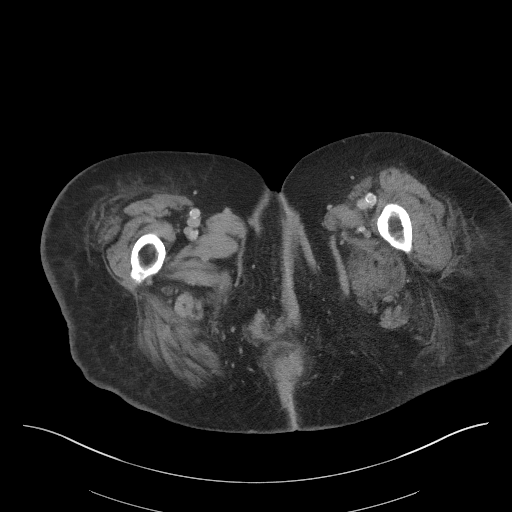
[im 7/111  bone]
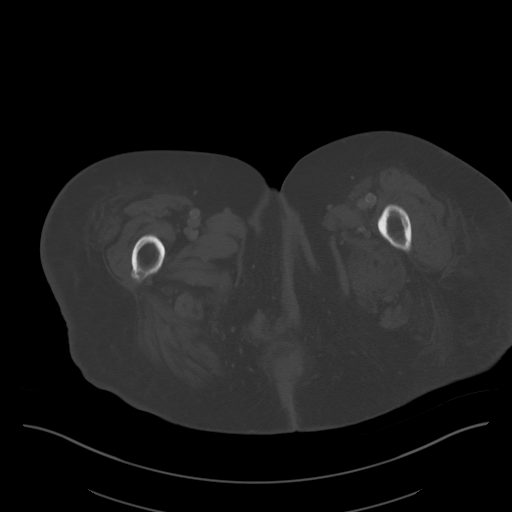
[im 14/111  soft-tissue]
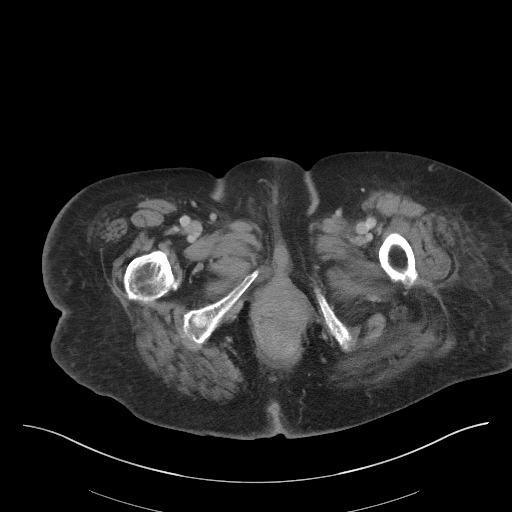
[im 28/111  soft-tissue]
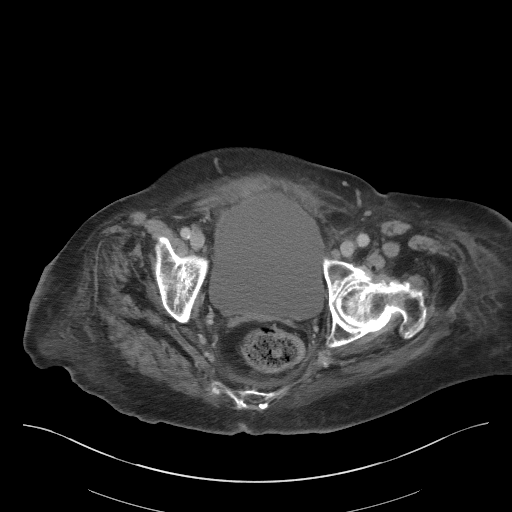
[im 35/111  soft-tissue]
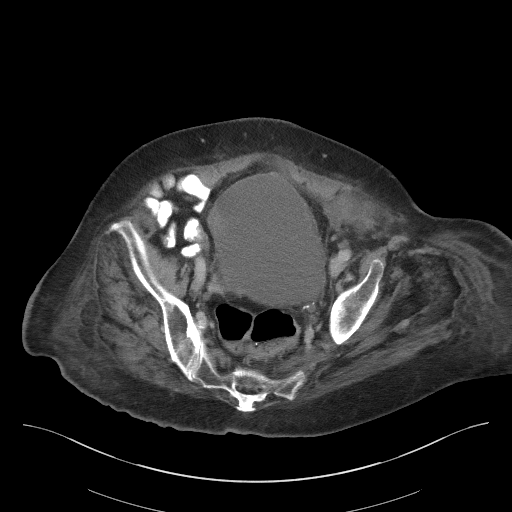
[im 42/111  soft-tissue]
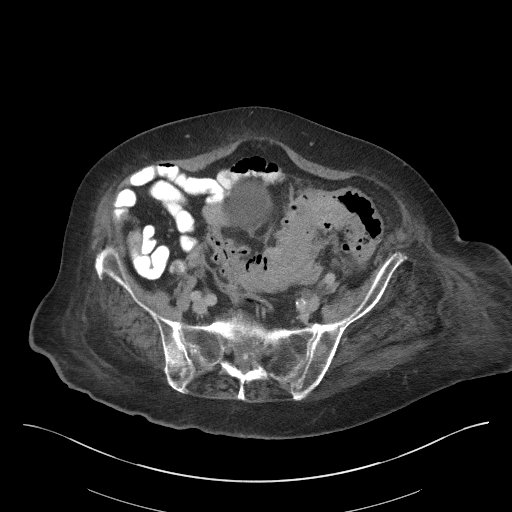
[im 49/111  soft-tissue]
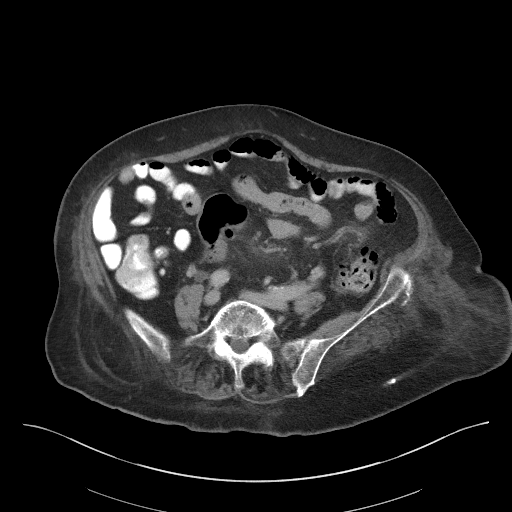
[im 62/111  soft-tissue]
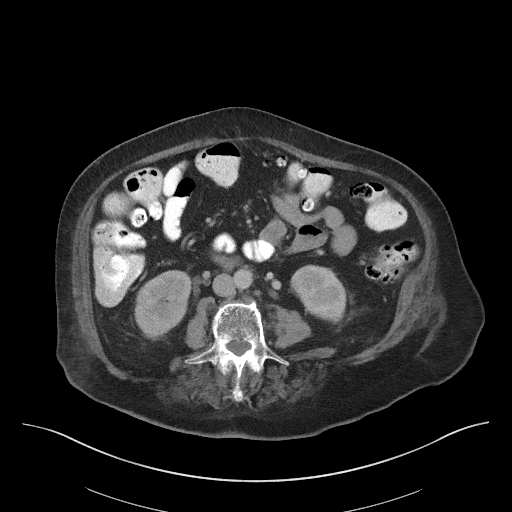
[im 69/111  soft-tissue]
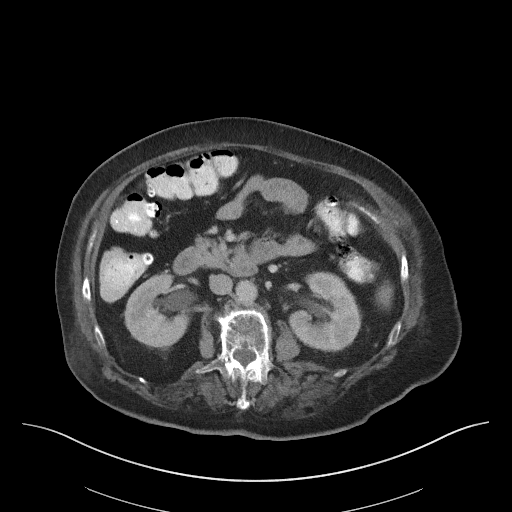
[im 76/111  soft-tissue]
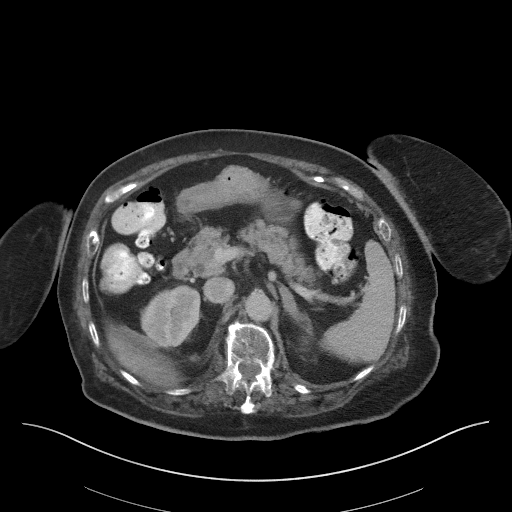
[im 76/111  bone]
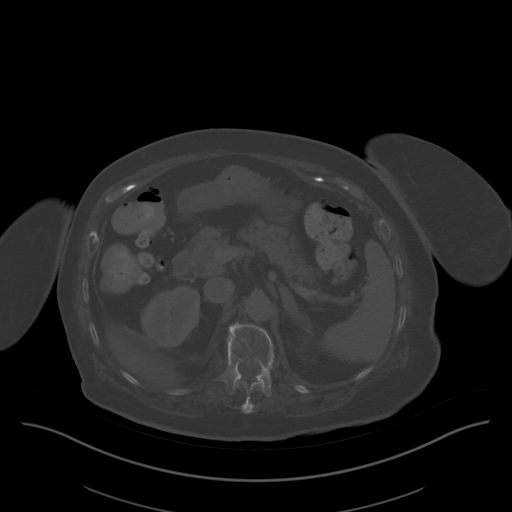
[im 83/111  soft-tissue]
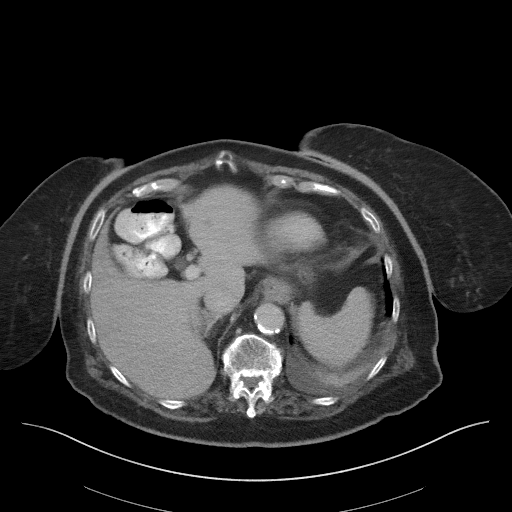
[im 97/111  soft-tissue]
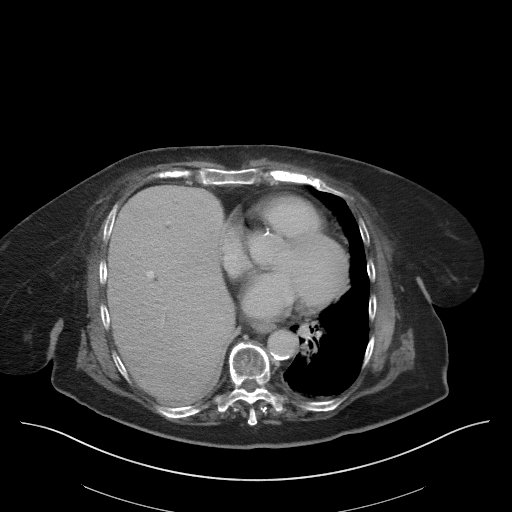
[im 104/111  soft-tissue]
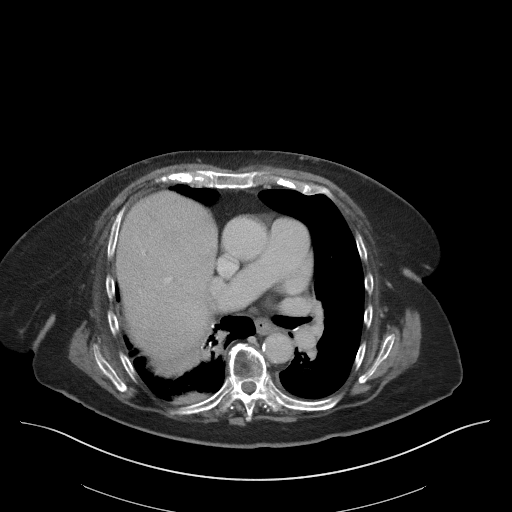

[Series 7: coronal st · coronal · 0.94mm/px · 3 of 99 slices shown]
[im 33/99  soft-tissue]
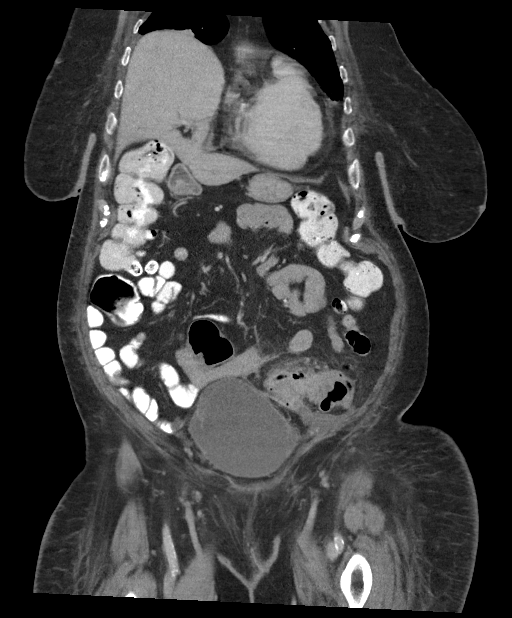
[im 44/99  soft-tissue]
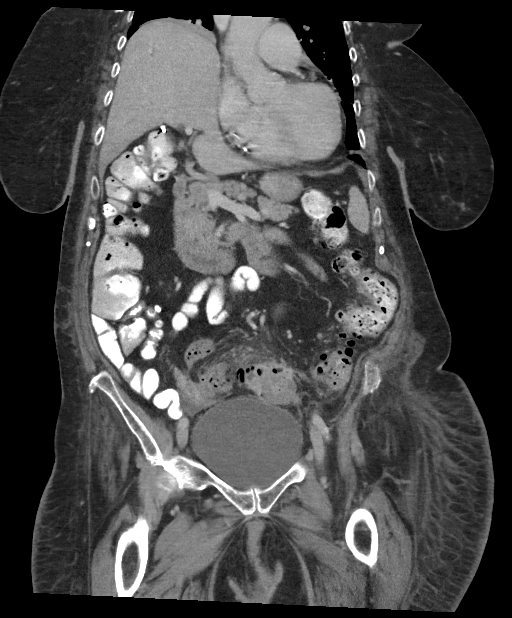
[im 55/99  soft-tissue]
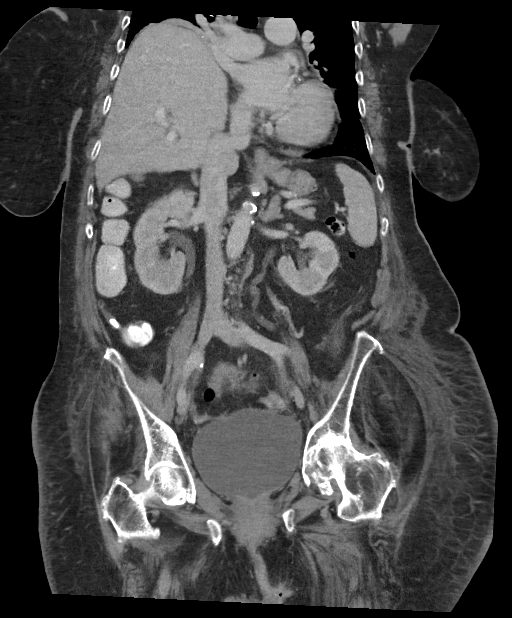

[15 of 46 positions shown; findings below may reference images not displayed]

FINDINGS: Lower chest: Heart is top-normal in size. Three-vessel coronary
arteriosclerosis is noted. Mild-to-moderate aortic atherosclerosis
without dissection or aneurysm. No pericardial effusion. Dense right
basilar atelectasis. Interval slight increase in small left effusion
with compressive atelectasis at the left lung base.

Hepatobiliary: Mild reservoir effect status post cholecystectomy
with slight intra and extrahepatic ductal dilatation. No
choledocholithiasis. Homogeneous attenuation of the liver without
mass.

Pancreas: No ductal dilatation, mass or inflammation.

Spleen: Normal

Adrenals/Urinary Tract: Stable mild thickening of the left adrenal
gland. No focal adrenal mass. No nephrolithiasis. Stable water
attenuating cysts of the right kidney, the largest is interpolar
measuring 12 mm. Mild hydroureteronephrosis is noted bilaterally,
new since prior exam without obstructive uropathy or mass. Findings
are likely related to distention of the urinary bladder. Bladder
diverticulosis is noted bilaterally.

Stomach/Bowel: Small hiatal hernia. Decompressed stomach. No small
bowel obstruction or inflammation. Normal contrast filled appendix.
Scattered colonic diverticulosis along the ascending and transverse
colon with greater degree of colonic diverticulosis along the left
colon. Pericolonic inflammation is noted along the sigmoid colon
compatible with acute uncomplicated diverticulitis. No bowel
obstruction or abscess.

Vascular/Lymphatic: Aortic atherosclerosis. No enlarged abdominal or
pelvic lymph nodes. Patent splenic and portal veins. Patent branch
vessels off the aorta.

Reproductive: Status post hysterectomy. No adnexal masses.

Other: No abdominal wall hernia or abnormality. No abdominopelvic
ascites.

Musculoskeletal: Stable degenerative change along the thoracolumbar
spine. No aggressive osseous lesions.
IMPRESSION: 1. Redemonstration of a long segment of diverticulitis involving the
sigmoid colon without complication. No bowel obstruction, abscess,
or free air. Findings are similar in appearance.
2. Bibasilar atelectasis with slight increase in small left
effusion.
3. Three-vessel coronary arteriosclerosis. Aortic atherosclerosis
without aneurysm.
4. Diverticulosis of the bladder.
